# Patient Record
Sex: Female | Born: 1955 | Race: White | Hispanic: No | Marital: Married | Smoking: Never smoker
Health system: Southern US, Community
[De-identification: ages and names within clinical notes are randomized; demographics above are authoritative.]

## PROBLEM LIST (undated history)

## (undated) DIAGNOSIS — C50919 Malignant neoplasm of unspecified site of unspecified female breast: Secondary | ICD-10-CM

## (undated) DIAGNOSIS — Z923 Personal history of irradiation: Secondary | ICD-10-CM

## (undated) DIAGNOSIS — M1990 Unspecified osteoarthritis, unspecified site: Secondary | ICD-10-CM

## (undated) HISTORY — PX: ABDOMINAL HYSTERECTOMY: SHX81

## (undated) HISTORY — PX: OOPHORECTOMY: SHX86

---

## 2004-07-17 ENCOUNTER — Ambulatory Visit: Payer: Self-pay | Admitting: Internal Medicine

## 2005-02-18 ENCOUNTER — Ambulatory Visit: Payer: Self-pay | Admitting: Orthopedic Surgery

## 2005-10-14 ENCOUNTER — Ambulatory Visit: Payer: Self-pay | Admitting: Internal Medicine

## 2006-09-08 ENCOUNTER — Ambulatory Visit: Payer: Self-pay | Admitting: Internal Medicine

## 2006-12-06 ENCOUNTER — Ambulatory Visit: Payer: Self-pay | Admitting: Internal Medicine

## 2007-12-25 ENCOUNTER — Ambulatory Visit: Payer: Self-pay | Admitting: Unknown Physician Specialty

## 2007-12-28 ENCOUNTER — Ambulatory Visit: Payer: Self-pay | Admitting: Internal Medicine

## 2008-05-03 DIAGNOSIS — C50919 Malignant neoplasm of unspecified site of unspecified female breast: Secondary | ICD-10-CM

## 2008-05-03 HISTORY — PX: BREAST EXCISIONAL BIOPSY: SUR124

## 2008-05-03 HISTORY — PX: BREAST LUMPECTOMY: SHX2

## 2008-05-03 HISTORY — DX: Malignant neoplasm of unspecified site of unspecified female breast: C50.919

## 2009-01-30 ENCOUNTER — Ambulatory Visit: Payer: Self-pay | Admitting: Internal Medicine

## 2009-02-06 ENCOUNTER — Ambulatory Visit: Payer: Self-pay | Admitting: Internal Medicine

## 2009-03-03 ENCOUNTER — Ambulatory Visit: Payer: Self-pay | Admitting: Internal Medicine

## 2009-03-13 ENCOUNTER — Ambulatory Visit: Payer: Self-pay | Admitting: Surgery

## 2009-03-25 ENCOUNTER — Ambulatory Visit: Payer: Self-pay | Admitting: Internal Medicine

## 2009-04-02 ENCOUNTER — Ambulatory Visit: Payer: Self-pay | Admitting: Radiation Oncology

## 2009-04-02 ENCOUNTER — Ambulatory Visit: Payer: Self-pay | Admitting: Internal Medicine

## 2009-05-03 ENCOUNTER — Ambulatory Visit: Payer: Self-pay | Admitting: Internal Medicine

## 2009-05-03 ENCOUNTER — Ambulatory Visit: Payer: Self-pay | Admitting: Radiation Oncology

## 2009-06-03 ENCOUNTER — Ambulatory Visit: Payer: Self-pay | Admitting: Radiation Oncology

## 2009-06-03 ENCOUNTER — Ambulatory Visit: Payer: Self-pay | Admitting: Internal Medicine

## 2009-07-01 ENCOUNTER — Ambulatory Visit: Payer: Self-pay | Admitting: Radiation Oncology

## 2009-07-01 ENCOUNTER — Ambulatory Visit: Payer: Self-pay | Admitting: Internal Medicine

## 2009-08-01 ENCOUNTER — Ambulatory Visit: Payer: Self-pay | Admitting: Internal Medicine

## 2009-08-31 ENCOUNTER — Ambulatory Visit: Payer: Self-pay | Admitting: Internal Medicine

## 2009-09-10 ENCOUNTER — Ambulatory Visit: Payer: Self-pay | Admitting: Internal Medicine

## 2009-10-01 ENCOUNTER — Ambulatory Visit: Payer: Self-pay | Admitting: Internal Medicine

## 2010-01-01 ENCOUNTER — Ambulatory Visit: Payer: Self-pay | Admitting: Internal Medicine

## 2010-01-09 ENCOUNTER — Ambulatory Visit: Payer: Self-pay | Admitting: Internal Medicine

## 2010-01-31 ENCOUNTER — Ambulatory Visit: Payer: Self-pay | Admitting: Internal Medicine

## 2010-02-10 ENCOUNTER — Ambulatory Visit: Payer: Self-pay | Admitting: Internal Medicine

## 2010-07-08 ENCOUNTER — Ambulatory Visit: Payer: Self-pay | Admitting: Internal Medicine

## 2010-07-25 IMAGING — CT CT GUIDANCE PLACEMENT RAD THERAPY FIELDS
1 series · 16 of 32 positions shown, 20 images · non-contrast
Comparison: none

[Series 2: tx planning · axial · 0.98mm/px · z∈[-170,+155]mm · 16 of 138 slices shown, 20 images]
[im 9/138  soft-tissue]
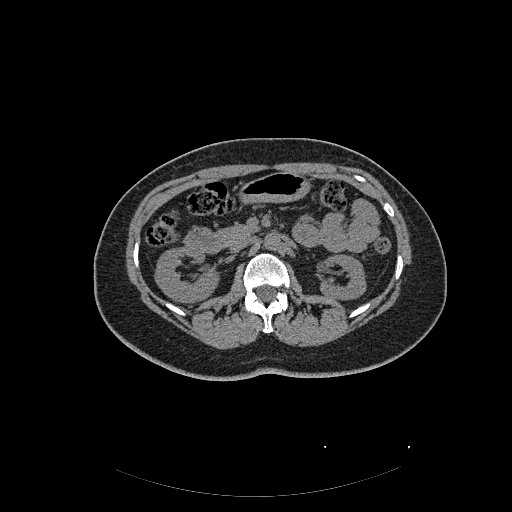
[im 9/138  bone]
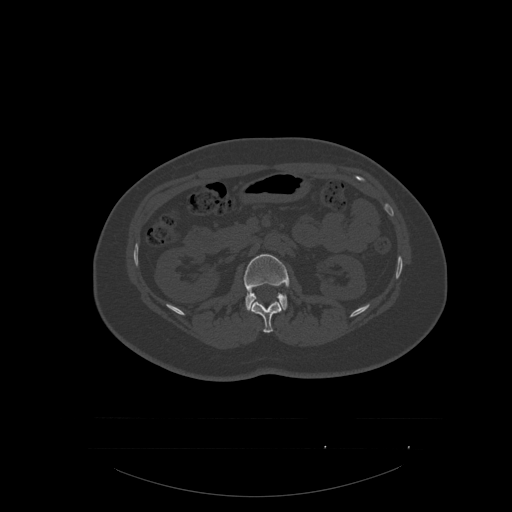
[im 18/138  soft-tissue]
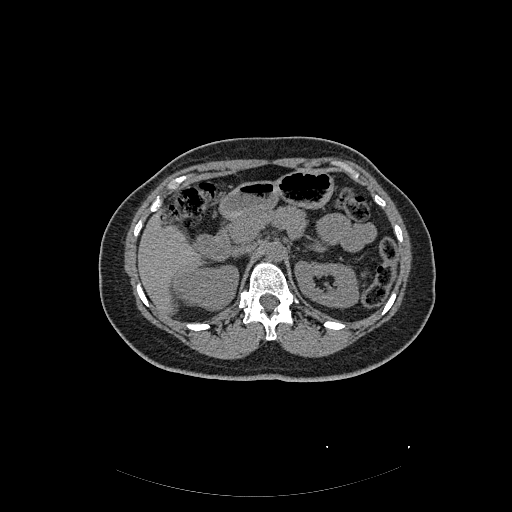
[im 27/138  soft-tissue]
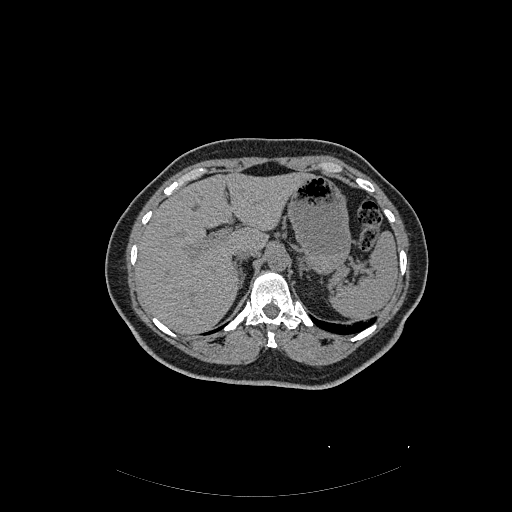
[im 36/138  soft-tissue]
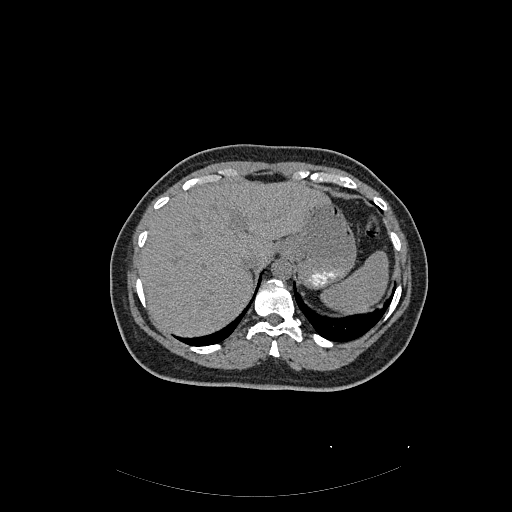
[im 45/138  soft-tissue]
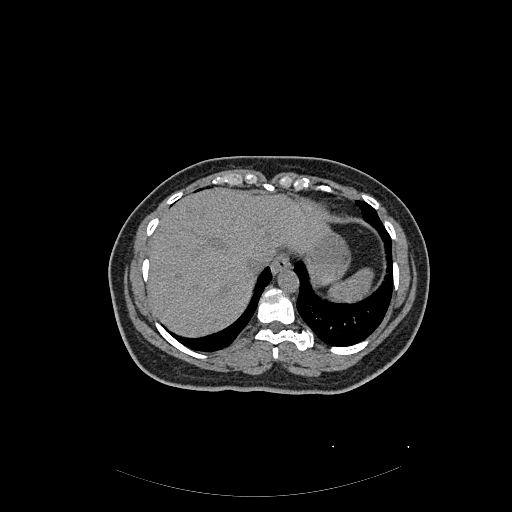
[im 54/138  soft-tissue]
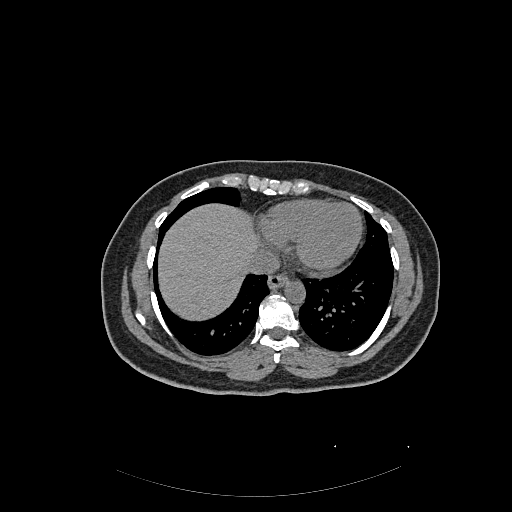
[im 62/138  soft-tissue]
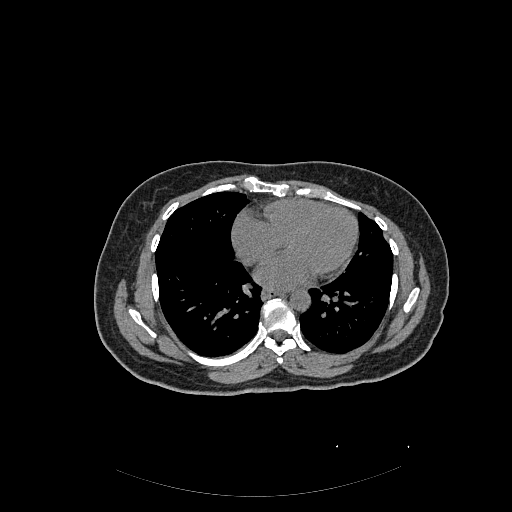
[im 76/138  soft-tissue]
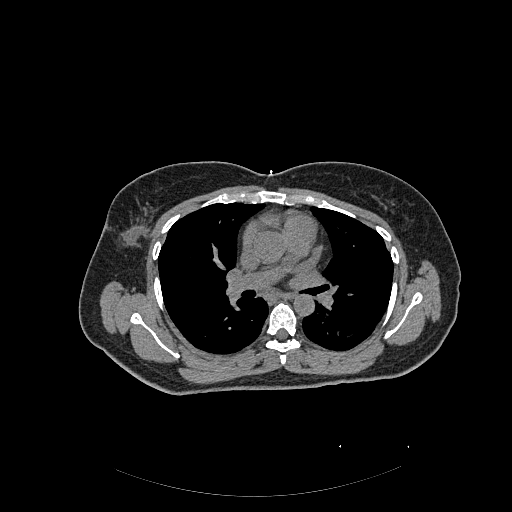
[im 84/138  soft-tissue]
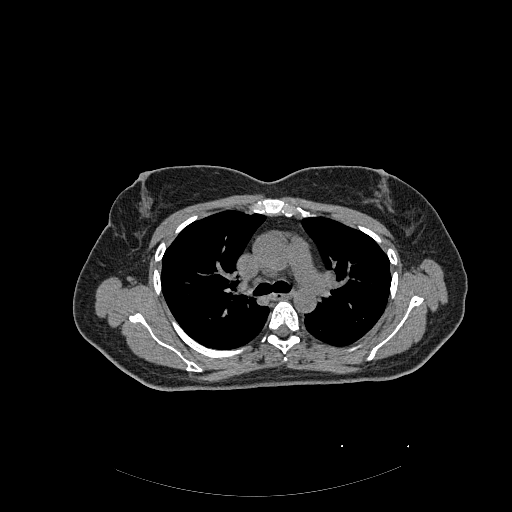
[im 84/138  bone]
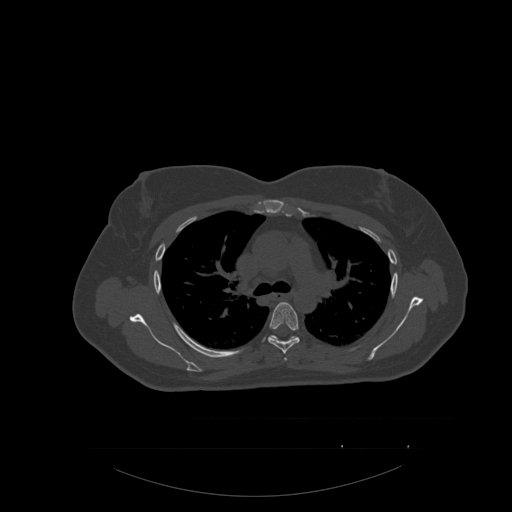
[im 93/138  soft-tissue]
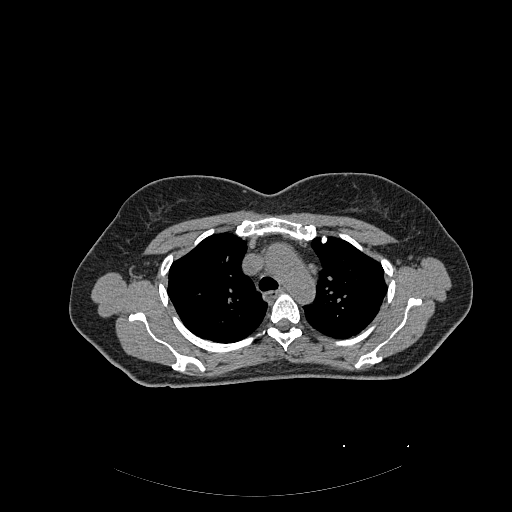
[im 102/138  soft-tissue]
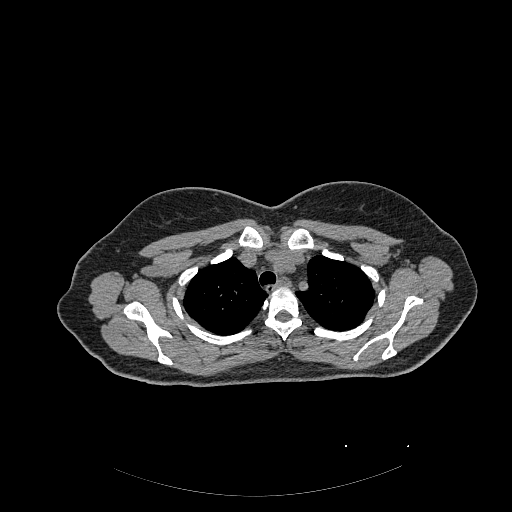
[im 111/138  soft-tissue]
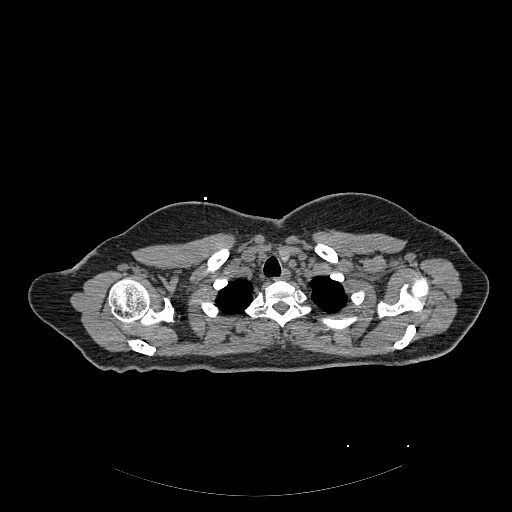
[im 120/138  soft-tissue]
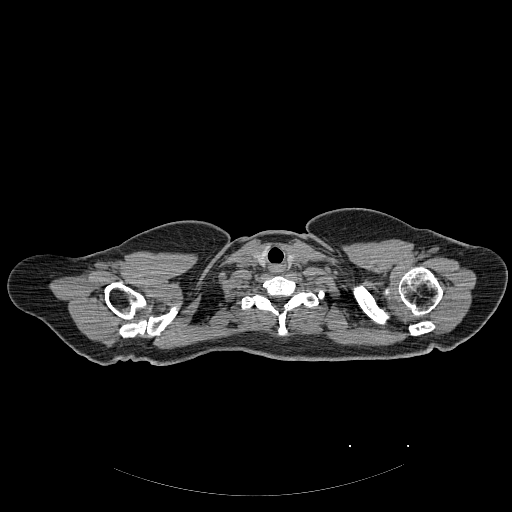
[im 120/138  lung]
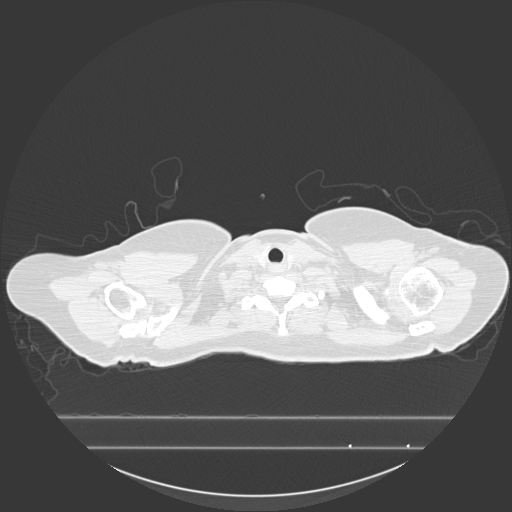
[im 124/138  lung]
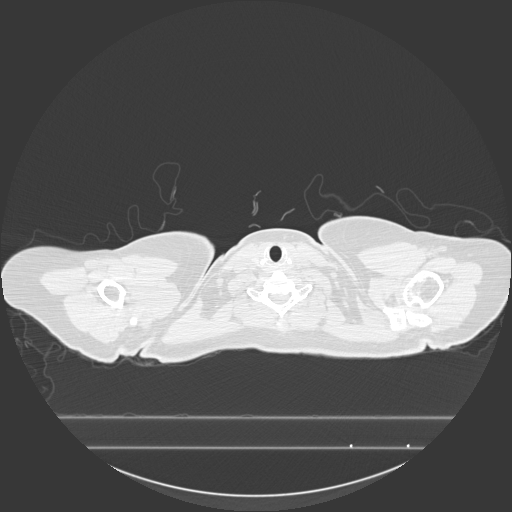
[im 129/138  soft-tissue]
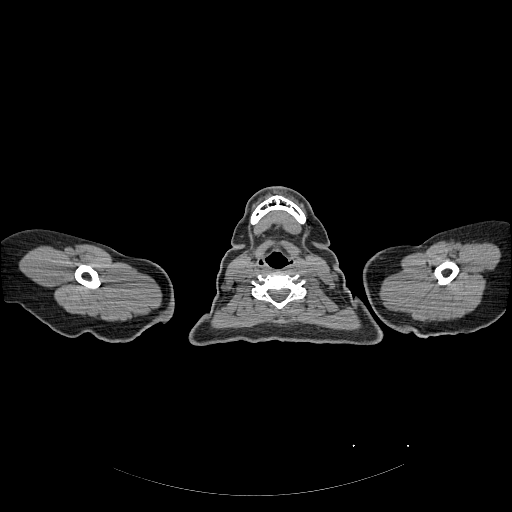
[im 129/138  lung]
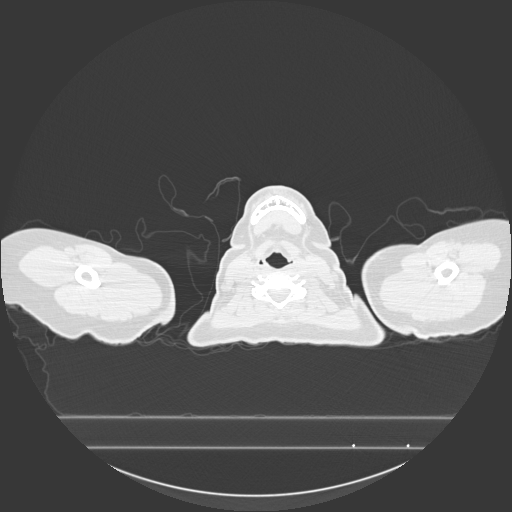
[im 133/138  lung]
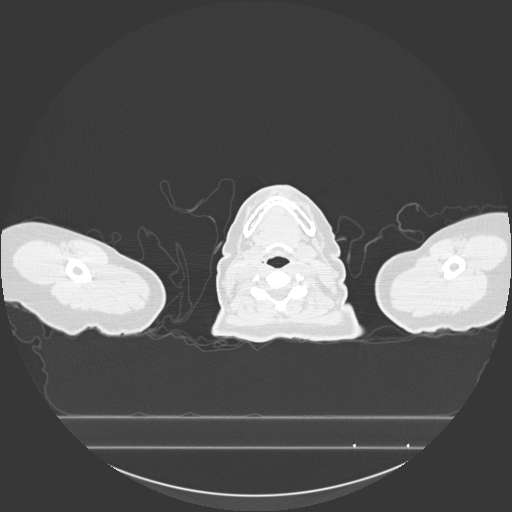

[16 of 32 positions shown; findings below may reference images not displayed]

IMAGES IMPORTED FROM THE [employer] WORKFLOW SYSTEM
NO DICTATION FOR STUDY

## 2010-08-02 ENCOUNTER — Ambulatory Visit: Payer: Self-pay | Admitting: Internal Medicine

## 2010-08-17 ENCOUNTER — Ambulatory Visit: Payer: Self-pay | Admitting: Internal Medicine

## 2011-01-15 ENCOUNTER — Ambulatory Visit: Payer: Self-pay | Admitting: Internal Medicine

## 2011-02-01 ENCOUNTER — Ambulatory Visit: Payer: Self-pay | Admitting: Internal Medicine

## 2011-02-15 ENCOUNTER — Ambulatory Visit: Payer: Self-pay | Admitting: Internal Medicine

## 2011-07-07 ENCOUNTER — Ambulatory Visit: Payer: Self-pay | Admitting: Internal Medicine

## 2011-07-16 LAB — CBC CANCER CENTER
Basophil #: 0 x10 3/mm (ref 0.0–0.1)
Basophil %: 0.3 %
Eosinophil #: 0.3 x10 3/mm (ref 0.0–0.7)
Eosinophil %: 4.4 %
HGB: 12.1 g/dL (ref 12.0–16.0)
Lymphocyte %: 30.7 %
MCHC: 34.5 g/dL (ref 32.0–36.0)
Monocyte %: 12.4 %
Neutrophil #: 3.3 x10 3/mm (ref 1.4–6.5)
Neutrophil %: 52.2 %
RDW: 12.8 % (ref 11.5–14.5)
WBC: 6.4 x10 3/mm (ref 3.6–11.0)

## 2011-07-16 LAB — CREATININE, SERUM
Creatinine: 0.85 mg/dL (ref 0.60–1.30)
EGFR (African American): >60
EGFR (Non-African Amer.): >60

## 2011-07-16 LAB — HEPATIC FUNCTION PANEL A (ARMC)
Alkaline Phosphatase: 72 U/L (ref 50–136)
Bilirubin, Direct: 0.1 mg/dL (ref 0.00–0.20)
Bilirubin,Total: 0.2 mg/dL (ref 0.2–1.0)
SGOT(AST): 22 U/L (ref 15–37)
SGPT (ALT): 25 U/L

## 2011-08-02 ENCOUNTER — Ambulatory Visit: Payer: Self-pay | Admitting: Internal Medicine

## 2012-01-31 ENCOUNTER — Ambulatory Visit: Payer: Self-pay | Admitting: Internal Medicine

## 2012-01-31 LAB — CBC CANCER CENTER
Basophil #: 0 x10 3/mm (ref 0.0–0.1)
Eosinophil %: 1.8 %
HCT: 36.1 % (ref 35.0–47.0)
HGB: 11.9 g/dL — ABNORMAL LOW (ref 12.0–16.0)
MCH: 32 pg (ref 26.0–34.0)
MCHC: 33 g/dL (ref 32.0–36.0)
MCV: 97 fL (ref 80–100)
Monocyte #: 0.6 x10 3/mm (ref 0.2–0.9)
Monocyte %: 10.3 %
Neutrophil %: 57.6 %
Platelet: 168 x10 3/mm (ref 150–440)
RBC: 3.72 10*6/uL — ABNORMAL LOW (ref 3.80–5.20)

## 2012-01-31 LAB — HEPATIC FUNCTION PANEL A (ARMC)
Alkaline Phosphatase: 72 U/L (ref 50–136)
Bilirubin, Direct: 0.1 mg/dL (ref 0.00–0.20)
Bilirubin,Total: 0.2 mg/dL (ref 0.2–1.0)
SGPT (ALT): 19 U/L (ref 12–78)
Total Protein: 7.2 g/dL (ref 6.4–8.2)

## 2012-01-31 LAB — CREATININE, SERUM: EGFR (Non-African Amer.): >60

## 2012-02-01 ENCOUNTER — Ambulatory Visit: Payer: Self-pay | Admitting: Internal Medicine

## 2012-02-23 ENCOUNTER — Ambulatory Visit: Payer: Self-pay | Admitting: Internal Medicine

## 2012-07-04 ENCOUNTER — Ambulatory Visit: Payer: Self-pay | Admitting: Internal Medicine

## 2012-08-01 ENCOUNTER — Ambulatory Visit: Payer: Self-pay | Admitting: Internal Medicine

## 2012-08-24 ENCOUNTER — Ambulatory Visit: Payer: Self-pay | Admitting: Podiatry

## 2013-02-26 ENCOUNTER — Ambulatory Visit: Payer: Self-pay | Admitting: Family Medicine

## 2013-09-06 ENCOUNTER — Ambulatory Visit: Payer: Self-pay | Admitting: Internal Medicine

## 2013-09-06 LAB — CBC CANCER CENTER
BASOS PCT: 0.2 %
Basophil #: 0 x10 3/mm (ref 0.0–0.1)
Eosinophil #: 0.3 x10 3/mm (ref 0.0–0.7)
Eosinophil %: 4.3 %
HCT: 37.4 % (ref 35.0–47.0)
HGB: 12.5 g/dL (ref 12.0–16.0)
LYMPHS ABS: 1.8 x10 3/mm (ref 1.0–3.6)
Lymphocyte %: 29.4 %
MCH: 32.2 pg (ref 26.0–34.0)
MCHC: 33.3 g/dL (ref 32.0–36.0)
MCV: 97 fL (ref 80–100)
Monocyte #: 0.8 x10 3/mm (ref 0.2–0.9)
Monocyte %: 13.3 %
Neutrophil #: 3.1 x10 3/mm (ref 1.4–6.5)
Neutrophil %: 52.8 %
PLATELETS: 192 x10 3/mm (ref 150–440)
RBC: 3.87 10*6/uL (ref 3.80–5.20)
RDW: 13.6 % (ref 11.5–14.5)
WBC: 6 x10 3/mm (ref 3.6–11.0)

## 2013-09-06 LAB — HEPATIC FUNCTION PANEL A (ARMC)
ALT: 24 U/L (ref 12–78)
Albumin: 3.4 g/dL (ref 3.4–5.0)
Alkaline Phosphatase: 57 U/L
Bilirubin, Direct: 0.1 mg/dL (ref 0.00–0.20)
Bilirubin,Total: 0.5 mg/dL (ref 0.2–1.0)
SGOT(AST): 20 U/L (ref 15–37)
TOTAL PROTEIN: 7 g/dL (ref 6.4–8.2)

## 2013-09-06 LAB — CREATININE, SERUM
Creatinine: 0.9 mg/dL (ref 0.60–1.30)
EGFR (Non-African Amer.): >60

## 2013-10-01 ENCOUNTER — Ambulatory Visit: Payer: Self-pay | Admitting: Internal Medicine

## 2014-03-21 ENCOUNTER — Ambulatory Visit: Payer: Self-pay | Admitting: Family Medicine

## 2015-02-12 ENCOUNTER — Other Ambulatory Visit: Payer: Self-pay | Admitting: Internal Medicine

## 2015-04-02 ENCOUNTER — Other Ambulatory Visit: Payer: Self-pay | Admitting: Family Medicine

## 2015-04-02 DIAGNOSIS — Z853 Personal history of malignant neoplasm of breast: Secondary | ICD-10-CM

## 2015-04-18 ENCOUNTER — Other Ambulatory Visit: Payer: Self-pay

## 2015-04-18 ENCOUNTER — Ambulatory Visit: Payer: Self-pay

## 2015-04-30 ENCOUNTER — Ambulatory Visit
Admission: RE | Admit: 2015-04-30 | Discharge: 2015-04-30 | Disposition: A | Discharge status: Home / Self Care | Payer: BC Managed Care – PPO | Source: Ambulatory Visit | Attending: Family Medicine | Admitting: Family Medicine

## 2015-04-30 ENCOUNTER — Ambulatory Visit: Payer: Self-pay

## 2015-04-30 DIAGNOSIS — Z9889 Other specified postprocedural states: Secondary | ICD-10-CM | POA: Diagnosis not present

## 2015-04-30 DIAGNOSIS — Z853 Personal history of malignant neoplasm of breast: Secondary | ICD-10-CM | POA: Diagnosis present

## 2015-04-30 HISTORY — DX: Malignant neoplasm of unspecified site of unspecified female breast: C50.919

## 2016-04-22 ENCOUNTER — Other Ambulatory Visit: Payer: Self-pay | Admitting: Family Medicine

## 2016-04-22 DIAGNOSIS — R1312 Dysphagia, oropharyngeal phase: Secondary | ICD-10-CM

## 2016-04-29 ENCOUNTER — Other Ambulatory Visit: Payer: Self-pay | Admitting: Family Medicine

## 2016-04-29 DIAGNOSIS — Z1231 Encounter for screening mammogram for malignant neoplasm of breast: Secondary | ICD-10-CM

## 2016-05-12 ENCOUNTER — Ambulatory Visit
Admission: RE | Admit: 2016-05-12 | Discharge: 2016-05-12 | Disposition: A | Discharge status: Home / Self Care | Payer: BC Managed Care – PPO | Source: Ambulatory Visit | Attending: Family Medicine | Admitting: Family Medicine

## 2016-05-12 ENCOUNTER — Encounter: Payer: Self-pay | Admitting: Radiology

## 2016-05-12 DIAGNOSIS — Z1231 Encounter for screening mammogram for malignant neoplasm of breast: Secondary | ICD-10-CM | POA: Insufficient documentation

## 2016-05-12 HISTORY — DX: Personal history of irradiation: Z92.3

## 2016-05-17 ENCOUNTER — Ambulatory Visit
Admission: RE | Admit: 2016-05-17 | Discharge: 2016-05-17 | Disposition: A | Discharge status: Home / Self Care | Payer: BC Managed Care – PPO | Source: Ambulatory Visit | Attending: Family Medicine | Admitting: Family Medicine

## 2016-05-17 DIAGNOSIS — R1310 Dysphagia, unspecified: Secondary | ICD-10-CM | POA: Insufficient documentation

## 2016-05-17 DIAGNOSIS — R1312 Dysphagia, oropharyngeal phase: Secondary | ICD-10-CM

## 2016-05-17 NOTE — Therapy (Signed)
[place], [place], [address] Phone: [phone]   Fax:     Modified Barium Swallow  Patient Details  Name: Sabrina Turner MRN: [id number] Date of Birth: 1956-03-22 No Data Recorded  Encounter Date: 05/17/2016      End of Session - 05/17/16 1340    Visit Number 1   Number of Visits 1   Date for SLP Re-Evaluation 05/17/16      Past Medical History:  Diagnosis Date  . Breast cancer ([place]) 2010   right breast lumpectomy with rad tx  . Personal history of radiation therapy     Past Surgical History:  Procedure Laterality Date  . ABDOMINAL HYSTERECTOMY    . OOPHORECTOMY      There were no vitals filed for this visit.   Subjective: Patient behavior: (alertness, ability to follow instructions, etc.): Patient is able to relay her swallowing history and follow directions  Chief complaint: "difficulty and painful swallowing especially when she eats big bites and eats fast"   Objective:  Radiological Procedure: A videoflouroscopic evaluation of oral-preparatory, reflex initiation, and pharyngeal phases of the swallow was performed; as well as a screening of the upper esophageal phase.  I. POSTURE: Upright in MBS chair  II. VIEW: Lateral, A-P  III. COMPENSATORY STRATEGIES: N/A  IV. BOLUSES ADMINISTERED:   Thin Liquid: 2 teaspoon presentations, 3 rapid consecutive sips   Nectar-thick Liquid: 2 moderate size sips    Puree: 2 teaspoon presentations   Mechanical Soft: 1/4 graham cracker in applesauce  V. RESULTS OF EVALUATION: A. ORAL PREPARATORY PHASE: (The lips, tongue, and velum are observed for strength and coordination)       **Overall Severity Rating: Within normal limits  B. SWALLOW INITIATION/REFLEX: (The reflex is normal if "triggered" by the time the bolus reached the base of the tongue)  **Overall Severity Rating: Within normal limits  C. PHARYNGEAL PHASE: (Pharyngeal  function is normal if the bolus shows rapid, smooth, and continuous transit through the pharynx and there is no pharyngeal residue after the swallow)  **Overall Severity Rating:Minimal; decreased tongue base retraction, decreased hyolaryngeal movement, trace to mild pharyngeal residue  D. LARYNGEAL PENETRATION: (Material entering into the laryngeal inlet/vestibule but not aspirated) None  E. ASPIRATION: None  F. ESOPHAGEAL PHASE: (Screening of the upper esophagus) In the cervical esophagus there is a finger-like protrusion along the posterior wall during swallow (does not impede flow of boluses) consistent with prominent cricopharyngeus.  An esophageal sweep in the upright position with liquid and solid consistencies showed mid- and distal-esophageal retention with retrograde flow below the level of the pharyngoesophageal segment.     ASSESSMENT: 61 year old woman with report of dysphagia ("difficulty and painful swallowing especially when she eats big bites and eats fast") is presenting with minimal oropharyngeal dysphagia characterized by minimally reduced pharyngeal pressure generation due to minimally reduced hyolaryngeal movement and minimally reduced tongue base retraction with trace-to-mild pharyngeal residue post swallow.  Oral control of the bolus including oral hold, rotary mastication, and anterior to posterior transfer are within normal limits. Timing of the pharyngeal swallow is within normal limits.  There was no observed laryngeal penetration or aspiration during this study.  In the cervical esophagus there is a finger-like protrusion along the posterior wall during swallow (does not impede flow of boluses) consistent with prominent cricopharyngeus.  An esophageal sweep in the upright position with liquid and solid consistencies showed mid- and distal-esophageal retention with retrograde flow below  the level of the pharyngoesophageal segment.   The patient's pain may be due to effortfull  swallowing to move food along, with muscle fatigue causing pain.  She was advised that her swallow is safe and the symptoms are uncomfortable rather than indicative of a serious problem.  The pharyngeal findings are consistent with effects of laryngopharyngeal reflux (inflammation, edema, and resultant decreased sensation of the larynx and pharynx).  The patient may benefit from referral to GI for esophageal assessment and ENT to assess for silent reflux.  PLAN/RECOMMENDATIONS:   A. Diet: Regular; may want to soften or moisten for comfort   B. Swallowing Precautions: When discomfort occurs, stop eating, slowly sip liquid to encourage peristalic waves, and swallow gently   C. Recommended consultation to: GI and ENT   D. Therapy recommendations N/A   E. Results and recommendations were discussed with the patient immediately following the study and the final report routed to the referring MD.    Dysphagia, oropharyngeal - Plan: DG SWALLOWING FUNC-SPEECH PATHOLOGY, DG SWALLOWING FUNC-SPEECH PATHOLOGY        Problem List There are no active problems to display for this patient.  Leroy Sea, MS/CCC- SLP  Lou Miner 05/17/2016, 1:40 PM  [facility] [place], [place], [address] Phone: [phone]   Fax:     Name: Sabrina Turner MRN: [id number] Date of Birth: December 14, 1955

## 2016-10-04 ENCOUNTER — Ambulatory Visit: Admit: 2016-10-04 | Payer: BC Managed Care – PPO | Admitting: Unknown Physician Specialty

## 2016-10-04 SURGERY — ESOPHAGOGASTRODUODENOSCOPY (EGD) WITH PROPOFOL
Anesthesia: General

## 2017-09-13 ENCOUNTER — Other Ambulatory Visit: Payer: Self-pay | Admitting: Family Medicine

## 2017-09-13 DIAGNOSIS — Z1231 Encounter for screening mammogram for malignant neoplasm of breast: Secondary | ICD-10-CM

## 2017-09-20 ENCOUNTER — Ambulatory Visit: Payer: BC Managed Care – PPO

## 2017-10-03 ENCOUNTER — Inpatient Hospital Stay: Admission: RE | Admit: 2017-10-03 | Payer: BC Managed Care – PPO | Source: Ambulatory Visit

## 2017-11-23 ENCOUNTER — Ambulatory Visit
Admission: RE | Admit: 2017-11-23 | Discharge: 2017-11-23 | Disposition: A | Discharge status: Home / Self Care | Payer: BC Managed Care – PPO | Source: Ambulatory Visit | Attending: Family Medicine | Admitting: Family Medicine

## 2017-11-23 DIAGNOSIS — Z1231 Encounter for screening mammogram for malignant neoplasm of breast: Secondary | ICD-10-CM | POA: Insufficient documentation

## 2018-04-18 ENCOUNTER — Other Ambulatory Visit: Payer: Self-pay

## 2018-04-18 ENCOUNTER — Encounter: Payer: Self-pay | Admitting: *Deleted

## 2018-04-24 NOTE — Discharge Instructions (Signed)
[facility] °[facility] °ENDOSCOPIC SINUS SURGERY °[facility] ° °What is Functional Endoscopic Sinus Surgery? ° The Surgery involves making the natural openings of the sinuses larger by removing the bony partitions that separate the sinuses from the nasal cavity.  The natural sinus lining is preserved as much as possible to allow the sinuses to resume normal function after the surgery.  In some patients nasal polyps (excessively swollen lining of the sinuses) may be removed to relieve obstruction of the sinus openings.  The surgery is performed through the nose using lighted scopes, which eliminates the need for incisions on the face.  A septoplasty is a different procedure which is sometimes performed with sinus surgery.  It involves straightening the boy partition that separates the two sides of your nose.  A crooked or deviated septum may need repair if is obstructing the sinuses or nasal airflow.  Turbinate reduction is also often performed during sinus surgery.  The turbinates are bony proturberances from the side walls of the nose which swell and can obstruct the nose in patients with sinus and allergy problems.  Their size can be surgically reduced to help relieve nasal obstruction. ° °What Can Sinus Surgery Do For Me? ° Sinus surgery can reduce the frequency of sinus infections requiring antibiotic treatment.  This can provide improvement in nasal congestion, post-nasal drainage, facial pressure and nasal obstruction.  Surgery will NOT prevent you from ever having an infection again, so it usually only for patients who get infections 4 or more times yearly requiring antibiotics, or for infections that do not clear with antibiotics.  It will not cure nasal allergies, so patients with allergies may still require medication to treat their allergies after surgery. Surgery may improve headaches related to sinusitis, however, some people will continue to  require medication to control sinus headaches related to allergies.  Surgery will do nothing for other forms of headache (migraine, tension or cluster). ° °What Are the Risks of Endoscopic Sinus Surgery? ° Current techniques allow surgery to be performed safely with little risk, however, there are rare complications that patients should be aware of.  Because the sinuses are located around the eyes, there is risk of eye injury, including blindness, though again, this would be quite rare. This is usually a result of bleeding behind the eye during surgery, which puts the vision oat risk, though there are treatments to protect the vision and prevent permanent disrupted by surgery causing a leak of the spinal fluid that surrounds the brain.  More serious complications would include bleeding inside the brain cavity or damage to the brain.  Again, all of these complications are uncommon, and spinal fluid leaks can be safely managed surgically if they occur.  The most common complication of sinus surgery is bleeding from the nose, which may require packing or cauterization of the nose.  Continued sinus have polyps may experience recurrence of the polyps requiring revision surgery.  Alterations of sense of smell or injury to the tear ducts are also rare complications.  ° °What is the Surgery Like, and what is the Recovery? ° The Surgery usually takes a couple of hours to perform, and is usually performed under a general anesthetic (completely asleep).  Patients are usually discharged home after a couple of hours.  Sometimes during surgery it is necessary to pack the nose to control bleeding, and the packing is left in place for 24 - 48 hours, and removed by your surgeon.    If a septoplasty was performed during the procedure, there is often a splint placed which must be removed after 5-7 days.   °Discomfort: Pain is usually mild to moderate, and can be controlled by prescription pain medication or acetaminophen (Tylenol).   Aspirin, Ibuprofen (Advil, Motrin), or Naprosyn (Aleve) should be avoided, as they can cause increased bleeding.  Most patients feel sinus pressure like they have a bad head cold for several days.  Sleeping with your head elevated can help reduce swelling and facial pressure, as can ice packs over the face.  A humidifier may be helpful to keep the mucous and blood from drying in the nose.  ° °Diet: There are no specific diet restrictions, however, you should generally start with clear liquids and a light diet of bland foods because the anesthetic can cause some nausea.  Advance your diet depending on how your stomach feels.  Taking your pain medication with food will often help reduce stomach upset which pain medications can cause. ° °Nasal Saline Irrigation: It is important to remove blood clots and dried mucous from the nose as it is healing.  This is done by having you irrigate the nose at least 3 - 4 times daily with a salt water solution.  We recommend using NeilMed Sinus Rinse (available at the drug store).  Fill the squeeze bottle with the solution, bend over a sink, and insert the tip of the squeeze bottle into the nose ½ of an inch.  Point the tip of the squeeze bottle towards the inside corner of the eye on the same side your irrigating.  Squeeze the bottle and gently irrigate the nose.  If you bend forward as you do this, most of the fluid will flow back out of the nose, instead of down your throat.   The solution should be warm, near body temperature, when you irrigate.   Each time you irrigate, you should use a full squeeze bottle.  ° °Note that if you are instructed to use Nasal Steroid Sprays at any time after your surgery, irrigate with saline BEFORE using the steroid spray, so you do not wash it all out of the nose. °Another product, Nasal Saline Gel (such as AYR Nasal Saline Gel) can be applied in each nostril 3 - 4 times daily to moisture the nose and reduce scabbing or crusting. ° °Bleeding:   Bloody drainage from the nose can be expected for several days, and patients are instructed to irrigate their nose frequently with salt water to help remove mucous and blood clots.  The drainage may be dark red or brown, though some fresh blood may be seen intermittently, especially after irrigation.  Do not blow you nose, as bleeding may occur. If you must sneeze, keep your mouth open to allow air to escape through your mouth. ° °If heavy bleeding occurs: Irrigate the nose with saline to rinse out clots, then spray the nose 3 - 4 times with Afrin Nasal Decongestant Spray.  The spray will constrict the blood vessels to slow bleeding.  Pinch the lower half of your nose shut to apply pressure, and lay down with your head elevated.  Ice packs over the nose may help as well. If bleeding persists despite these measures, you should notify your doctor.  Do not use the Afrin routinely to control nasal congestion after surgery, as it can result in worsening congestion and may affect healing.  ° ° ° °Activity: Return to work varies among patients. Most patients will be   out of work at least 5 - 7 days to recover.  Patient may return to work after they are off of narcotic pain medication, and feeling well enough to perform the functions of their job.  Patients must avoid heavy lifting (over 10 pounds) or strenuous physical for 2 weeks after surgery, so your employer may need to assign you to light duty, or keep you out of work longer if light duty is not possible.  NOTE: you should not drive, operate dangerous machinery, do any mentally demanding tasks or make any important legal or financial decisions while on narcotic pain medication and recovering from the general anesthetic.  °  °Call Your Doctor Immediately if You Have Any of the Following: °1. Bleeding that you cannot control with the above measures °2. Loss of vision, double vision, bulging of the eye or black eyes. °3. Fever over 101 degrees °4. Neck stiffness with  severe headache, fever, nausea and change in mental state. °You are always encourage to call anytime with concerns, however, please call with requests for pain medication refills during office hours. ° °Office Endoscopy: During follow-up visits your doctor will remove any packing or splints that may have been placed and evaluate and clean your sinuses endoscopically.  Topical anesthetic will be used to make this as comfortable as possible, though you may want to take your pain medication prior to the visit.  How often this will need to be done varies from patient to patient.  After complete recovery from the surgery, you may need follow-up endoscopy from time to time, particularly if there is concern of recurrent infection or nasal polyps. ° ° °General Anesthesia, Adult, Care After °This sheet gives you information about how to care for yourself after your procedure. Your health care provider may also give you more specific instructions. If you have problems or questions, contact your health care provider. °What can I expect after the procedure? °After the procedure, the following side effects are common: °· Pain or discomfort at the IV site. °· Nausea. °· Vomiting. °· Sore throat. °· Trouble concentrating. °· Feeling cold or chills. °· Weak or tired. °· Sleepiness and fatigue. °· Soreness and body aches. These side effects can affect parts of the body that were not involved in surgery. °Follow these instructions at home: ° °For at least 24 hours after the procedure: °· Have a responsible adult stay with you. It is important to have someone help care for you until you are awake and alert. °· Rest as needed. °· Do not: °? Participate in activities in which you could fall or become injured. °? Drive. °? Use heavy machinery. °? Drink alcohol. °? Take sleeping pills or medicines that cause drowsiness. °? Make important decisions or sign legal documents. °? Take care of children on your own. °Eating and  drinking °· Follow any instructions from your health care provider about eating or drinking restrictions. °· When you feel hungry, start by eating small amounts of foods that are soft and easy to digest (bland), such as toast. Gradually return to your regular diet. °· Drink enough fluid to keep your urine pale yellow. °· If you vomit, rehydrate by drinking water, juice, or clear broth. °General instructions °· If you have sleep apnea, surgery and certain medicines can increase your risk for breathing problems. Follow instructions from your health care provider about wearing your sleep device: °? Anytime you are sleeping, including during daytime naps. °? While taking prescription pain medicines, sleeping medicines, or medicines   you drowsy. °· Return to your normal activities as told by your health care provider. Ask your health care provider what activities are safe for you. °· Take over-the-counter and prescription medicines only as told by your health care provider. °· If you smoke, do not smoke without supervision. °· Keep all follow-up visits as told by your health care provider. This is important. °Contact a health care provider if: °· You have nausea or vomiting that does not get better with medicine. °· You cannot eat or drink without vomiting. °· You have pain that does not get better with medicine. °· You are unable to pass urine. °· You develop a skin rash. °· You have a fever. °· You have redness around your IV site that gets worse. °Get help right away if: °· You have difficulty breathing. °· You have chest pain. °· You have blood in your urine or stool, or you vomit blood. °Summary °· After the procedure, it is common to have a sore throat or nausea. It is also common to feel tired. °· Have a responsible adult stay with you for the first 24 hours after general anesthesia. It is important to have someone help care for you until you are awake and alert. °· When you feel hungry, start by eating small amounts of foods  that are soft and easy to digest (bland), such as toast. Gradually return to your regular diet. °· Drink enough fluid to keep your urine pale yellow. °· Return to your normal activities as told by your health care provider. Ask your health care provider what activities are safe for you. °This information is not intended to replace advice given to you by your health care provider. Make sure you discuss any questions you have with your health care provider. °Document Released: 07/26/2000 Document Revised: 12/03/2016 Document Reviewed: 12/03/2016 °Elsevier Interactive Patient Education © 2019 [employer] ° °

## 2018-04-27 ENCOUNTER — Encounter
Admission: RE | Disposition: A | Discharge status: Home / Self Care | Payer: Self-pay | Source: Home / Self Care | Attending: Otolaryngology

## 2018-04-27 ENCOUNTER — Ambulatory Visit: Payer: BC Managed Care – PPO | Admitting: Anesthesiology

## 2018-04-27 ENCOUNTER — Ambulatory Visit
Admission: RE | Admit: 2018-04-27 | Discharge: 2018-04-27 | Disposition: A | Discharge status: Home / Self Care | Payer: BC Managed Care – PPO | Attending: Otolaryngology | Admitting: Otolaryngology

## 2018-04-27 DIAGNOSIS — J343 Hypertrophy of nasal turbinates: Secondary | ICD-10-CM | POA: Diagnosis not present

## 2018-04-27 DIAGNOSIS — Z79899 Other long term (current) drug therapy: Secondary | ICD-10-CM | POA: Diagnosis not present

## 2018-04-27 DIAGNOSIS — J328 Other chronic sinusitis: Secondary | ICD-10-CM | POA: Insufficient documentation

## 2018-04-27 DIAGNOSIS — J342 Deviated nasal septum: Secondary | ICD-10-CM | POA: Diagnosis not present

## 2018-04-27 DIAGNOSIS — Z853 Personal history of malignant neoplasm of breast: Secondary | ICD-10-CM | POA: Diagnosis not present

## 2018-04-27 HISTORY — PX: NASAL SEPTOPLASTY W/ TURBINOPLASTY: SHX2070

## 2018-04-27 HISTORY — DX: Unspecified osteoarthritis, unspecified site: M19.90

## 2018-04-27 SURGERY — SEPTOPLASTY, NOSE, WITH NASAL TURBINATE REDUCTION
Anesthesia: General | Site: Nose | Laterality: Bilateral

## 2018-04-27 MED ORDER — SUCCINYLCHOLINE CHLORIDE 20 MG/ML IJ SOLN
INTRAMUSCULAR | Status: DC | PRN
  Administered 2018-04-27: 100 mg via INTRAVENOUS

## 2018-04-27 MED ORDER — HYDROCODONE-ACETAMINOPHEN 5-325 MG PO TABS: 1.0000 | ORAL_TABLET | Freq: Four times a day (QID) | ORAL | 0 refills | Status: AC | PRN

## 2018-04-27 MED ORDER — PHENYLEPHRINE HCL 0.5 % NA SOLN
NASAL | Status: DC | PRN
  Administered 2018-04-27: 13:00:00 via TOPICAL

## 2018-04-27 MED ORDER — GLYCOPYRROLATE 0.2 MG/ML IJ SOLN
INTRAMUSCULAR | Status: DC | PRN
  Administered 2018-04-27: 0.1 mg via INTRAVENOUS

## 2018-04-27 MED ORDER — LACTATED RINGERS IV SOLN
INTRAVENOUS | Status: DC
  Administered 2018-04-27: 12:00:00 via INTRAVENOUS

## 2018-04-27 MED ORDER — MIDAZOLAM HCL 5 MG/5ML IJ SOLN
INTRAMUSCULAR | Status: DC | PRN
  Administered 2018-04-27: 2 mg via INTRAVENOUS

## 2018-04-27 MED ORDER — FENTANYL CITRATE (PF) 100 MCG/2ML IJ SOLN
INTRAMUSCULAR | Status: DC | PRN
  Administered 2018-04-27: 100 ug via INTRAVENOUS

## 2018-04-27 MED ORDER — OXYCODONE HCL 5 MG PO TABS
5.0000 mg | ORAL_TABLET | Freq: Once | ORAL | Status: AC
  Administered 2018-04-27: 5 mg via ORAL

## 2018-04-27 MED ORDER — ACETAMINOPHEN 160 MG/5ML PO SOLN: 325.0000 mg | ORAL | Status: DC | PRN

## 2018-04-27 MED ORDER — DEXAMETHASONE SODIUM PHOSPHATE 4 MG/ML IJ SOLN
INTRAMUSCULAR | Status: DC | PRN
  Administered 2018-04-27: 10 mg via INTRAVENOUS

## 2018-04-27 MED ORDER — ONDANSETRON HCL 4 MG/2ML IJ SOLN
INTRAMUSCULAR | Status: DC | PRN
  Administered 2018-04-27: 4 mg via INTRAVENOUS

## 2018-04-27 MED ORDER — ONDANSETRON HCL 4 MG/2ML IJ SOLN
4.0000 mg | Freq: Once | INTRAMUSCULAR | Status: AC
  Administered 2018-04-27: 4 mg via INTRAVENOUS

## 2018-04-27 MED ORDER — LIDOCAINE HCL (CARDIAC) PF 100 MG/5ML IV SOSY
PREFILLED_SYRINGE | INTRAVENOUS | Status: DC | PRN
  Administered 2018-04-27: 40 mg via INTRAVENOUS

## 2018-04-27 MED ORDER — LIDOCAINE-EPINEPHRINE 1 %-1:100000 IJ SOLN
INTRAMUSCULAR | Status: DC | PRN
  Administered 2018-04-27: 6.5 mL

## 2018-04-27 MED ORDER — ACETAMINOPHEN 325 MG PO TABS: 325.0000 mg | ORAL_TABLET | ORAL | Status: DC | PRN

## 2018-04-27 MED ORDER — PROPOFOL 10 MG/ML IV BOLUS
INTRAVENOUS | Status: DC | PRN
  Administered 2018-04-27: 150 mg via INTRAVENOUS

## 2018-04-27 MED ORDER — CEPHALEXIN 500 MG PO CAPS: 500.0000 mg | ORAL_CAPSULE | Freq: Two times a day (BID) | ORAL | 0 refills | Status: DC

## 2018-04-27 MED ORDER — OXYMETAZOLINE HCL 0.05 % NA SOLN
1.0000 | Freq: Two times a day (BID) | NASAL | Status: DC
  Administered 2018-04-27: 1 via NASAL

## 2018-04-27 MED ORDER — CEFAZOLIN SODIUM-DEXTROSE 2-4 GM/100ML-% IV SOLN
2.0000 g | Freq: Once | INTRAVENOUS | Status: AC
  Administered 2018-04-27: 2 g via INTRAVENOUS

## 2018-04-27 MED ORDER — EPHEDRINE SULFATE 50 MG/ML IJ SOLN
INTRAMUSCULAR | Status: DC | PRN
  Administered 2018-04-27: 5 mg via INTRAVENOUS
  Administered 2018-04-27: 10 mg via INTRAVENOUS

## 2018-04-27 MED ORDER — PREDNISONE 10 MG PO TABS: ORAL_TABLET | ORAL | 0 refills | Status: DC

## 2018-04-27 SURGICAL SUPPLY — 26 items
CANISTER SUCT 1200ML W/VALVE (MISCELLANEOUS) ×3 IMPLANT
COAGULATOR SUCT 8FR VV (MISCELLANEOUS) ×3 IMPLANT
ELECT REM PT RETURN 9FT ADLT (ELECTROSURGICAL) ×3
ELECTRODE REM PT RTRN 9FT ADLT (ELECTROSURGICAL) ×1 IMPLANT
GLOVE PI ULTRA LF STRL 7.5 (GLOVE) ×2 IMPLANT
GLOVE PI ULTRA NON LATEX 7.5 (GLOVE) ×4
GOWN STRL REUS W/ TWL LRG LVL3 (GOWN DISPOSABLE) ×1 IMPLANT
GOWN STRL REUS W/TWL LRG LVL3 (GOWN DISPOSABLE) ×2
KIT TURNOVER KIT A (KITS) ×3 IMPLANT
NEEDLE ANESTHESIA  27G X 3.5 (NEEDLE) ×2
NEEDLE ANESTHESIA 27G X 3.5 (NEEDLE) ×1 IMPLANT
NEEDLE HYPO 27GX1-1/4 (NEEDLE) ×3 IMPLANT
PACK ENT CUSTOM (PACKS) ×3 IMPLANT
PATTIES SURGICAL .5 X3 (DISPOSABLE) ×3 IMPLANT
SOL ANTI-FOG 6CC FOG-OUT (MISCELLANEOUS) ×1 IMPLANT
SOL FOG-OUT ANTI-FOG 6CC (MISCELLANEOUS) ×2
SPLINT NASAL SEPTAL BLV .50 ST (MISCELLANEOUS) ×3 IMPLANT
STRAP BODY AND KNEE 60X3 (MISCELLANEOUS) ×3 IMPLANT
SUT CHROMIC 3-0 (SUTURE) ×2
SUT CHROMIC 3-0 KS 27XMFL CR (SUTURE) ×1
SUT ETHILON 3-0 KS 30 BLK (SUTURE) ×3 IMPLANT
SUT PLAIN GUT 4-0 (SUTURE) ×3 IMPLANT
SUTURE CHRMC 3-0 KS 27XMFL CR (SUTURE) ×1 IMPLANT
SYR 3ML LL SCALE MARK (SYRINGE) ×3 IMPLANT
TOWEL OR 17X26 4PK STRL BLUE (TOWEL DISPOSABLE) ×3 IMPLANT
WATER STERILE IRR 250ML POUR (IV SOLUTION) ×3 IMPLANT

## 2018-04-27 NOTE — Anesthesia Procedure Notes (Signed)
Procedure Name: Intubation Date/Time: 04/27/2018 12:36 PM Performed by: Mayme Genta, CRNA Pre-anesthesia Checklist: Patient identified, Emergency Drugs available, Suction available, Patient being monitored and Timeout performed Patient Re-evaluated:Patient Re-evaluated prior to induction Oxygen Delivery Method: Circle system utilized Preoxygenation: Pre-oxygenation with 100% oxygen Induction Type: IV induction Ventilation: Mask ventilation without difficulty Laryngoscope Size: Miller and 2 Grade View: Grade I Tube type: Oral Rae Tube size: 7.0 mm Number of attempts: 1 Placement Confirmation: ETT inserted through vocal cords under direct vision,  positive ETCO2 and breath sounds checked- equal and bilateral Tube secured with: Tape Dental Injury: Teeth and Oropharynx as per pre-operative assessment

## 2018-04-27 NOTE — Anesthesia Preprocedure Evaluation (Addendum)
Anesthesia Evaluation  Patient identified by MRN, date of birth, ID band Patient awake    Reviewed: Allergy & Precautions, H&P , NPO status , Patient's Chart, lab work & pertinent test results, reviewed documented beta blocker date and time   Airway Mallampati: I  TM Distance: >3 FB Neck ROM: full    Dental no notable dental hx.    Pulmonary neg pulmonary ROS,    Pulmonary exam normal breath sounds clear to auscultation       Cardiovascular Exercise Tolerance: Good negative cardio ROS Normal cardiovascular exam Rhythm:regular Rate:Normal     Neuro/Psych negative neurological ROS  negative psych ROS   GI/Hepatic negative GI ROS, Neg liver ROS,   Endo/Other  negative endocrine ROS  Renal/GU negative Renal ROS  negative genitourinary   Musculoskeletal   Abdominal   Peds  Hematology negative hematology ROS (+) Hx of R breast CA and lumpectomy, 2010   Anesthesia Other Findings   Reproductive/Obstetrics negative OB ROS                            Anesthesia Physical Anesthesia Plan  ASA: II  Anesthesia Plan: General ETT   Post-op Pain Management:    Induction:   PONV Risk Score and Plan: Ondansetron and Dexamethasone  Airway Management Planned:   Additional Equipment:   Intra-op Plan:   Post-operative Plan:   Informed Consent: I have reviewed the patients History and Physical, chart, labs and discussed the procedure including the risks, benefits and alternatives for the proposed anesthesia with the patient or authorized representative who has indicated his/her understanding and acceptance.   Dental Advisory Given  Plan Discussed with: CRNA and Anesthesiologist  Anesthesia Plan Comments:        Anesthesia Quick Evaluation

## 2018-04-27 NOTE — Anesthesia Postprocedure Evaluation (Signed)
Anesthesia Post Note  Patient: Sabrina Turner  Procedure(s) Performed: NASAL SEPTOPLASTY WITH INFERIOR TURBINATE REDUCTION (Bilateral Nose)  Patient location during evaluation: PACU Anesthesia Type: General Level of consciousness: awake and alert Pain management: pain level controlled Vital Signs Assessment: post-procedure vital signs reviewed and stable Respiratory status: spontaneous breathing, nonlabored ventilation, respiratory function stable and patient connected to nasal cannula oxygen Cardiovascular status: blood pressure returned to baseline and stable Postop Assessment: no apparent nausea or vomiting Anesthetic complications: no    Trecia Rogers

## 2018-04-27 NOTE — H&P (Signed)
H&P has been reviewed and patient reevaluated, no changes necessary. To be downloaded later.  

## 2018-04-27 NOTE — Transfer of Care (Signed)
Immediate Anesthesia Transfer of Care Note  Patient: Sabrina Turner  Procedure(s) Performed: NASAL SEPTOPLASTY WITH INFERIOR TURBINATE REDUCTION (Bilateral Nose)  Patient Location: PACU  Anesthesia Type: General ETT  Level of Consciousness: awake, alert  and patient cooperative  Airway and Oxygen Therapy: Patient Spontanous Breathing and Patient connected to supplemental oxygen  Post-op Assessment: Post-op Vital signs reviewed, Patient's Cardiovascular Status Stable, Respiratory Function Stable, Patent Airway and No signs of Nausea or vomiting  Post-op Vital Signs: Reviewed and stable  Complications: No apparent anesthesia complications

## 2018-04-27 NOTE — Op Note (Addendum)
04/27/2018  1:36 PM  [id number]   Pre-Op Dx:  Deviated Nasal Septum, Hypertrophic Inferior Turbinates  Post-op Dx: Same  Proc: Nasal Septoplasty,  Partial Reduction right inferior Turbinate   Surg:  Sabrina Turner Sabrina Turner  Anes:  GOT  EBL: 50 mL  Comp: None  Findings: Septum markedly deviated to the left side with large spurs inferiorly pinching the left inferior turbinate.  The right inferior turbinate was overgrown.    Procedure: With the patient in a comfortable supine position,  general orotracheal anesthesia was induced without difficulty.     The patient received preoperative Afrin spray for topical decongestion and vasoconstriction.  Intravenous prophylactic antibiotics were administered.  At an appropriate level, the patient was placed in a semi-sitting position.  Nasal vibrissae were trimmed.   1% Xylocaine with 1:100,000 epinephrine, 6.5 cc's, was infiltrated into the anterior floor of the nose, into the nasal spine region, into the membranous columella, and finally into the submucoperichondrial plane of the septum on both sides.  Several minutes were allowed for this to take effect.  Cottoniod pledgetts soaked in Afrin and 4% Xylocaine were placed into both nasal cavities and left while the patient was prepped and draped in the standard fashion.  The materials were removed from the nose and observed to be intact and correct in number.  The nose was inspected with a headlight and zero degree scope with the findings as described above.  A left Killian incision was sharply executed and carried down to the quadrangular cartilage. The mucoperichondrium was elelvated along the quadrangular plate back to the bony-cartilaginous junction. The mucoperiostium was then elevated along the ethmoid plate and the vomer. The boney-catilaginous junction was then split with a freer elevator and the mucoperiosteum was elevated on the opposite side. The mucoperiosteum was then elevated along the  maxillary crest as needed to expose the crooked bone of the crest.  Boney spurs of the vomer and maxillary crest were removed with Donavan Foil forceps.  The cartilaginous plate was trimmed along its posterior and inferior borders of about 2 mm of cartilage to free it up inferiorly. Some of the deviated ethmoid plate was then fractured and removed with Takahashi forceps to free up the posterior border of the quadrangular plate and allow it to swing back to the midline. The mucosal flaps were placed back into their anatomic position to allow visualization of the airways. The septum now sat in the midline with an improved airway.  A 3-0 Chromic suture on a Keith needle in used to anchor the inferior septum at the nasal spine with a through and through suture. The mucosal flaps are then sutured together using a through and through whip stitch of 4-0 [place] with a mini-Keith needle. This was used to close the [place] incision as well.   The inferior turbinates were then inspected. An incision was created along the inferior aspect of the right inferior turbinate with removal of some of the inferior soft tissue and bone. Electrocautery was used to control bleeding in the area. The remaining turbinate was then outfractured to open up the airway further. There was no significant bleeding noted. The left inferior turbinate was then visualized.  This was pinched and narrow and lateralized and did not need any further trimming.  The airways were then visualized and showed open passageways on both sides that were significantly improved compared to before surgery. There was no signifcant bleeding. Nasal splints were applied to both sides of the septum using Xomed 0.49mm regular  sized splints that were trimmed, and then held in position with a 3-0 Nylon through and through suture.  The patient was turned back over to anesthesia, and awakened, extubated, and taken to the PACU in satisfactory condition.  Dispo:   PACU to  home  Plan: Ice, elevation, narcotic analgesia, steroid taper, and prophylactic antibiotics for the duration of indwelling nasal foreign bodies.  We will reevaluate the patient in the office in 6 days and remove the septal splints.  Return to work in 10 days, strenuous activities in two weeks.   Sabrina Turner Sabrina Turner 04/27/2018 1:36 PM

## 2018-04-28 ENCOUNTER — Encounter: Payer: Self-pay | Admitting: Otolaryngology

## 2019-02-09 ENCOUNTER — Other Ambulatory Visit: Payer: Self-pay | Admitting: Family Medicine

## 2019-02-09 DIAGNOSIS — Z1231 Encounter for screening mammogram for malignant neoplasm of breast: Secondary | ICD-10-CM

## 2019-03-06 ENCOUNTER — Ambulatory Visit
Admission: RE | Admit: 2019-03-06 | Discharge: 2019-03-06 | Disposition: A | Discharge status: Home / Self Care | Payer: BC Managed Care – PPO | Source: Ambulatory Visit | Attending: Family Medicine | Admitting: Family Medicine

## 2019-03-06 ENCOUNTER — Other Ambulatory Visit: Payer: Self-pay

## 2019-03-06 ENCOUNTER — Encounter (INDEPENDENT_AMBULATORY_CARE_PROVIDER_SITE_OTHER): Payer: Self-pay

## 2019-03-06 DIAGNOSIS — Z1231 Encounter for screening mammogram for malignant neoplasm of breast: Secondary | ICD-10-CM | POA: Insufficient documentation

## 2019-04-20 ENCOUNTER — Ambulatory Visit: Payer: BC Managed Care – PPO | Attending: Internal Medicine

## 2019-04-20 DIAGNOSIS — Z20822 Contact with and (suspected) exposure to covid-19: Secondary | ICD-10-CM

## 2019-04-21 LAB — NOVEL CORONAVIRUS, NAA: SARS-CoV-2, NAA: NOT DETECTED

## 2019-05-29 ENCOUNTER — Ambulatory Visit: Payer: BC Managed Care – PPO | Attending: Internal Medicine

## 2019-05-29 DIAGNOSIS — Z20822 Contact with and (suspected) exposure to covid-19: Secondary | ICD-10-CM

## 2019-05-30 LAB — NOVEL CORONAVIRUS, NAA: SARS-CoV-2, NAA: NOT DETECTED

## 2019-07-12 ENCOUNTER — Ambulatory Visit: Payer: BC Managed Care – PPO

## 2019-07-13 ENCOUNTER — Other Ambulatory Visit: Payer: Self-pay

## 2019-07-13 ENCOUNTER — Ambulatory Visit: Payer: BC Managed Care – PPO | Attending: Internal Medicine

## 2019-07-13 DIAGNOSIS — Z23 Encounter for immunization: Secondary | ICD-10-CM

## 2019-07-13 NOTE — Progress Notes (Signed)
   Covid-19 Vaccination Clinic  Name:  SHARENE PINKHAM    MRN: [id number] DOB: May 08, 1955  07/13/2019  Ms. Roque was observed post Covid-19 immunization for 15 minutes without incident. She was provided with Vaccine Information Sheet and instruction to access the V-Safe system.   Ms. [place] was instructed to call 911 with any severe reactions post vaccine: Marland Kitchen Difficulty breathing  . Swelling of face and throat  . A fast heartbeat  . A bad rash all over body  . Dizziness and weakness   Immunizations Administered    Name Date Dose VIS Date Route   Pfizer COVID-19 Vaccine 07/13/2019 11:49 AM 0.3 mL 04/13/2019 Intramuscular   Manufacturer: [place]   Lot: [id number]   [place]: [id number]

## 2019-08-07 ENCOUNTER — Ambulatory Visit: Payer: BC Managed Care – PPO | Attending: Internal Medicine

## 2019-08-07 DIAGNOSIS — Z23 Encounter for immunization: Secondary | ICD-10-CM

## 2019-08-07 NOTE — Progress Notes (Signed)
   Covid-19 Vaccination Clinic  Name:  LENORA HUEBERT    MRN: [id number] DOB: [birth year]  08/07/2019  Ms. Luiz was observed post Covid-19 immunization for 15 minutes without incident. She was provided with Vaccine Information Sheet and instruction to access the V-Safe system.   Ms. Cheevers was instructed to call 911 with any severe reactions post vaccine: Marland Kitchen Difficulty breathing  . Swelling of face and throat  . A fast heartbeat  . A bad rash all over body  . Dizziness and weakness   Immunizations Administered    Name Date Dose VIS Date Route   Pfizer COVID-19 Vaccine 08/07/2019 11:34 AM 0.3 mL 04/13/2019 Intramuscular   Manufacturer: [place]   Lot: [id number]   [place]: [id number]

## 2020-04-08 ENCOUNTER — Other Ambulatory Visit: Payer: Self-pay | Admitting: Family Medicine

## 2020-04-08 DIAGNOSIS — Z1231 Encounter for screening mammogram for malignant neoplasm of breast: Secondary | ICD-10-CM

## 2020-05-28 ENCOUNTER — Other Ambulatory Visit: Payer: Self-pay

## 2020-05-28 ENCOUNTER — Ambulatory Visit
Admission: RE | Admit: 2020-05-28 | Discharge: 2020-05-28 | Disposition: A | Discharge status: Home / Self Care | Payer: BC Managed Care – PPO | Source: Ambulatory Visit | Attending: Family Medicine | Admitting: Family Medicine

## 2020-05-28 DIAGNOSIS — Z1231 Encounter for screening mammogram for malignant neoplasm of breast: Secondary | ICD-10-CM | POA: Diagnosis present

## 2021-07-17 ENCOUNTER — Other Ambulatory Visit: Payer: Self-pay | Admitting: Family Medicine

## 2021-07-17 DIAGNOSIS — Z1231 Encounter for screening mammogram for malignant neoplasm of breast: Secondary | ICD-10-CM

## 2021-08-25 ENCOUNTER — Ambulatory Visit
Admission: RE | Admit: 2021-08-25 | Discharge: 2021-08-25 | Disposition: A | Discharge status: Home / Self Care | Payer: BC Managed Care – PPO | Source: Ambulatory Visit | Attending: Family Medicine | Admitting: Family Medicine

## 2021-08-25 DIAGNOSIS — Z1231 Encounter for screening mammogram for malignant neoplasm of breast: Secondary | ICD-10-CM | POA: Insufficient documentation

## 2021-08-27 ENCOUNTER — Other Ambulatory Visit: Payer: Self-pay | Admitting: Family Medicine

## 2021-08-27 DIAGNOSIS — R928 Other abnormal and inconclusive findings on diagnostic imaging of breast: Secondary | ICD-10-CM

## 2021-08-27 DIAGNOSIS — N63 Unspecified lump in unspecified breast: Secondary | ICD-10-CM

## 2021-09-09 ENCOUNTER — Ambulatory Visit
Admission: RE | Admit: 2021-09-09 | Discharge: 2021-09-09 | Disposition: A | Discharge status: Home / Self Care | Payer: BC Managed Care – PPO | Source: Ambulatory Visit | Attending: Family Medicine | Admitting: Family Medicine

## 2021-09-09 DIAGNOSIS — N63 Unspecified lump in unspecified breast: Secondary | ICD-10-CM

## 2021-09-09 DIAGNOSIS — R928 Other abnormal and inconclusive findings on diagnostic imaging of breast: Secondary | ICD-10-CM | POA: Diagnosis present

## 2021-09-24 ENCOUNTER — Other Ambulatory Visit: Payer: Self-pay | Admitting: Family Medicine

## 2021-09-24 DIAGNOSIS — N6489 Other specified disorders of breast: Secondary | ICD-10-CM

## 2022-03-16 ENCOUNTER — Other Ambulatory Visit: Payer: BC Managed Care – PPO

## 2022-04-02 ENCOUNTER — Ambulatory Visit
Admission: RE | Admit: 2022-04-02 | Discharge: 2022-04-02 | Disposition: A | Discharge status: Home / Self Care | Payer: BC Managed Care – PPO | Source: Ambulatory Visit | Attending: Family Medicine | Admitting: Family Medicine

## 2022-04-02 DIAGNOSIS — N6489 Other specified disorders of breast: Secondary | ICD-10-CM | POA: Insufficient documentation

## 2022-08-21 ENCOUNTER — Encounter: Payer: Self-pay | Admitting: Emergency Medicine

## 2022-08-21 ENCOUNTER — Ambulatory Visit
Admission: EM | Admit: 2022-08-21 | Discharge: 2022-08-21 | Disposition: A | Discharge status: Home / Self Care | Payer: BC Managed Care – PPO | Attending: Family Medicine | Admitting: Family Medicine

## 2022-08-21 DIAGNOSIS — N3001 Acute cystitis with hematuria: Secondary | ICD-10-CM | POA: Diagnosis present

## 2022-08-21 LAB — URINALYSIS, W/ REFLEX TO CULTURE (INFECTION SUSPECTED)
Bilirubin Urine: NEGATIVE
Glucose, UA: NEGATIVE mg/dL
Ketones, ur: NEGATIVE mg/dL
Nitrite: NEGATIVE
Protein, ur: 100 mg/dL — AB
RBC / HPF: >50 RBC/hpf (ref 0–5)
Specific Gravity, Urine: 1.02 (ref 1.005–1.030)
pH: 7.5 (ref 5.0–8.0)

## 2022-08-21 MED ORDER — CEPHALEXIN 500 MG PO CAPS: 500.0000 mg | ORAL_CAPSULE | Freq: Three times a day (TID) | ORAL | 0 refills | Status: AC

## 2022-08-21 NOTE — ED Triage Notes (Signed)
Patient c/o blood in her urine, and urinary frequency and urgency that started this morning.  Patient denies fevers.

## 2022-08-21 NOTE — ED Provider Notes (Signed)
[facility]    CSN: [id number] Arrival date & time: 08/21/22  1309      History   Chief Complaint Chief Complaint  Patient presents with   Urinary Frequency   Hematuria    HPI Sabrina Turner is a 67 y.o. female presents for evaluation of dysuria.  Patient reports this morning she woke with urinary urgency, frequency and, hematuria.  Denies burning with urination, fevers, nausea/vomiting, flank pain.  No vaginal discharge or STD concern.  States she was in [place] on April 2 when she developed urinary symptoms and her PCP called her in Keflex she took 3 times a day for 7 days.  States symptoms completely resolved.  When her symptoms began this morning she did take one of her husbands 500 mg tablets of Keflex.  Denies history of recurrent UTIs.  She did start drinking sweet tea again which she thinks might be contributing.  No other concerns at this time.   Urinary Frequency  Hematuria    Past Medical History:  Diagnosis Date   Arthritis    right knee   Breast cancer 2010   right breast lumpectomy with rad tx   Personal history of radiation therapy     There are no problems to display for this patient.   Past Surgical History:  Procedure Laterality Date   ABDOMINAL HYSTERECTOMY     BREAST EXCISIONAL BIOPSY Right 2010   hx breast ca radation    BREAST LUMPECTOMY Right 2010   excisional bx hx of breast ca radaition   NASAL SEPTOPLASTY W/ TURBINOPLASTY Bilateral 04/27/2018   Procedure: NASAL SEPTOPLASTY WITH INFERIOR TURBINATE REDUCTION;  Surgeon: Vernie Murders, MD;  Location: [facility];  Service: ENT;  Laterality: Bilateral;   OOPHORECTOMY      OB History   No obstetric history on file.      Home Medications    Prior to Admission medications   Medication Sig Start Date End Date Taking? Authorizing Provider  cephALEXin (KEFLEX) 500 MG capsule Take 1 capsule (500 mg total) by mouth 3 (three) times daily for 7 days. 08/21/22 08/28/22 Yes  Radford Pax, NP  Cyanocobalamin (VITAMIN B-12 PO) Take by mouth daily.    [provider]  EPINEPHrine (AUVI-Q) 0.3 mg/0.3 mL IJ SOAJ injection Inject into the muscle once.    [provider]  Ferrous Sulfate (IRON) 325 (65 Fe) MG TABS Take by mouth daily.    [provider]  Multiple Vitamins-Minerals (CENTRUM SILVER PO) Take by mouth daily.    [provider]  predniSONE (DELTASONE) 10 MG tablet Start with 3 pills tomorrow. Taper over the next 6 days.  3,3,2,2,1,1. 04/27/18   Vernie Murders, MD  venlafaxine (EFFEXOR) 75 MG tablet Take 225 mg by mouth daily.    [provider]    Family History Family History  Problem Relation Age of Onset   Breast cancer Maternal Aunt        mat great aunt    Social History Social History   Tobacco Use   Smoking status: Never   Smokeless tobacco: Never  Vaping Use   Vaping Use: Never used  Substance Use Topics   Alcohol use: Yes    Comment: rare - Holidays     Allergies   Patient has no known allergies.   Review of Systems Review of Systems  Genitourinary:  Positive for frequency and hematuria.     Physical Exam Triage Vital Signs ED Triage Vitals  Enc Vitals  Group     BP 08/21/22 1417 124/78     Pulse Rate 08/21/22 1417 64     Resp 08/21/22 1417 14     Temp 08/21/22 1417 98.2 F (36.8 C)     Temp Source 08/21/22 1417 Oral     SpO2 08/21/22 1417 100 %     Weight 08/21/22 1415 215 lb (97.5 kg)     Height 08/21/22 1415 5\' 4"  (1.626 m)     Head Circumference --      Peak Flow --      Pain Score 08/21/22 1415 5     Pain Loc --      Pain Edu? --      Excl. in GC? --    No data found.  Updated Vital Signs BP 124/78 (BP Location: Left Arm)   Pulse 64   Temp 98.2 F (36.8 C) (Oral)   Resp 14   Ht 5\' 4"  (1.626 m)   Wt 215 lb (97.5 kg)   SpO2 100%   BMI 36.90 kg/m   Visual Acuity Right Eye Distance:   Left Eye Distance:   Bilateral Distance:    Right Eye Near:   Left  Eye Near:    Bilateral Near:     Physical Exam Vitals and nursing note reviewed.  Constitutional:      Appearance: Normal appearance.  HENT:     Head: Normocephalic and atraumatic.  Eyes:     Pupils: Pupils are equal, round, and reactive to light.  Cardiovascular:     Rate and Rhythm: Normal rate.  Pulmonary:     Effort: Pulmonary effort is normal.  Abdominal:     Tenderness: There is no right CVA tenderness or left CVA tenderness.  Skin:    General: Skin is warm and dry.  Neurological:     General: No focal deficit present.     Mental Status: She is alert and oriented to person, place, and time.  Psychiatric:        Mood and Affect: Mood normal.        Behavior: Behavior normal.      UC Treatments / Results  Labs (all labs ordered are listed, but only abnormal results are displayed) Labs Reviewed  URINALYSIS, W/ REFLEX TO CULTURE (INFECTION SUSPECTED) - Abnormal; Notable for the following components:      Result Value   APPearance HAZY (*)    Hgb urine dipstick LARGE (*)    Protein, ur 100 (*)    Leukocytes,Ua MODERATE (*)    Bacteria, UA FEW (*)    All other components within normal limits  URINE CULTURE    EKG   Radiology No results found.  Procedures Procedures (including critical care time)  Medications Ordered in UC Medications - No data to display  Initial Impression / Assessment and Plan / UC Course  I have reviewed the triage vital signs and the nursing notes.  Pertinent labs & imaging results that were available during my care of the patient were reviewed by me and considered in my medical decision making (see chart for details).     UA positive for UTI.  Culture pending As patient is already started taking Keflex we will resume Keflex 3 times daily for 7 days patient to follow-up with PCP if symptoms do not improve ER precautions reviewed and patient verbalized understanding Final Clinical Impressions(s) / UC Diagnoses   Final diagnoses:   Acute cystitis with hematuria     Discharge Instructions  Start Keflex three times a day for 7 days The clinic will contact you with results of your urine culture if it is positive Rest and fluids Follow-up with your PCP if symptoms do not improve Please go to the ER for any worsening symptoms     ED Prescriptions     Medication Sig Dispense Auth. Provider   cephALEXin (KEFLEX) 500 MG capsule Take 1 capsule (500 mg total) by mouth 3 (three) times daily for 7 days. 21 capsule Radford Pax, NP      PDMP not reviewed this encounter.   Radford Pax, NP 08/21/22 1459

## 2022-08-21 NOTE — Discharge Instructions (Signed)
Start Keflex three times a day for 7 days The clinic will contact you with results of your urine culture if it is positive Rest and fluids Follow-up with your PCP if symptoms do not improve Please go to the ER for any worsening symptoms

## 2022-08-22 LAB — URINE CULTURE: Culture: NO GROWTH

## 2022-08-30 ENCOUNTER — Other Ambulatory Visit: Payer: Self-pay | Admitting: Family Medicine

## 2022-08-30 DIAGNOSIS — N6489 Other specified disorders of breast: Secondary | ICD-10-CM

## 2022-10-07 ENCOUNTER — Ambulatory Visit
Admission: RE | Admit: 2022-10-07 | Discharge: 2022-10-07 | Disposition: A | Discharge status: Home / Self Care | Payer: BC Managed Care – PPO | Source: Ambulatory Visit | Attending: Family Medicine | Admitting: Family Medicine

## 2022-10-07 DIAGNOSIS — N6489 Other specified disorders of breast: Secondary | ICD-10-CM

## 2022-12-31 IMAGING — MG MM DIGITAL DIAGNOSTIC UNILAT*L* W/ TOMO W/ CAD
4 series · 4 of 12 positions shown · non-contrast
Comparison: Previous exam(s).

CLINICAL DATA: Callback for possible LEFT breast masses. History of
RIGHT malignancy in 6222.

EXAM:
DIGITAL DIAGNOSTIC UNILATERAL LEFT MAMMOGRAM WITH TOMOSYNTHESIS AND
CAD; ULTRASOUND LEFT BREAST LIMITED
TECHNIQUE: Left digital diagnostic mammography and breast tomosynthesis was
performed. The images were evaluated with computer-aided detection.;
Targeted ultrasound examination of the left breast was performed.

[L ML synth-2D]
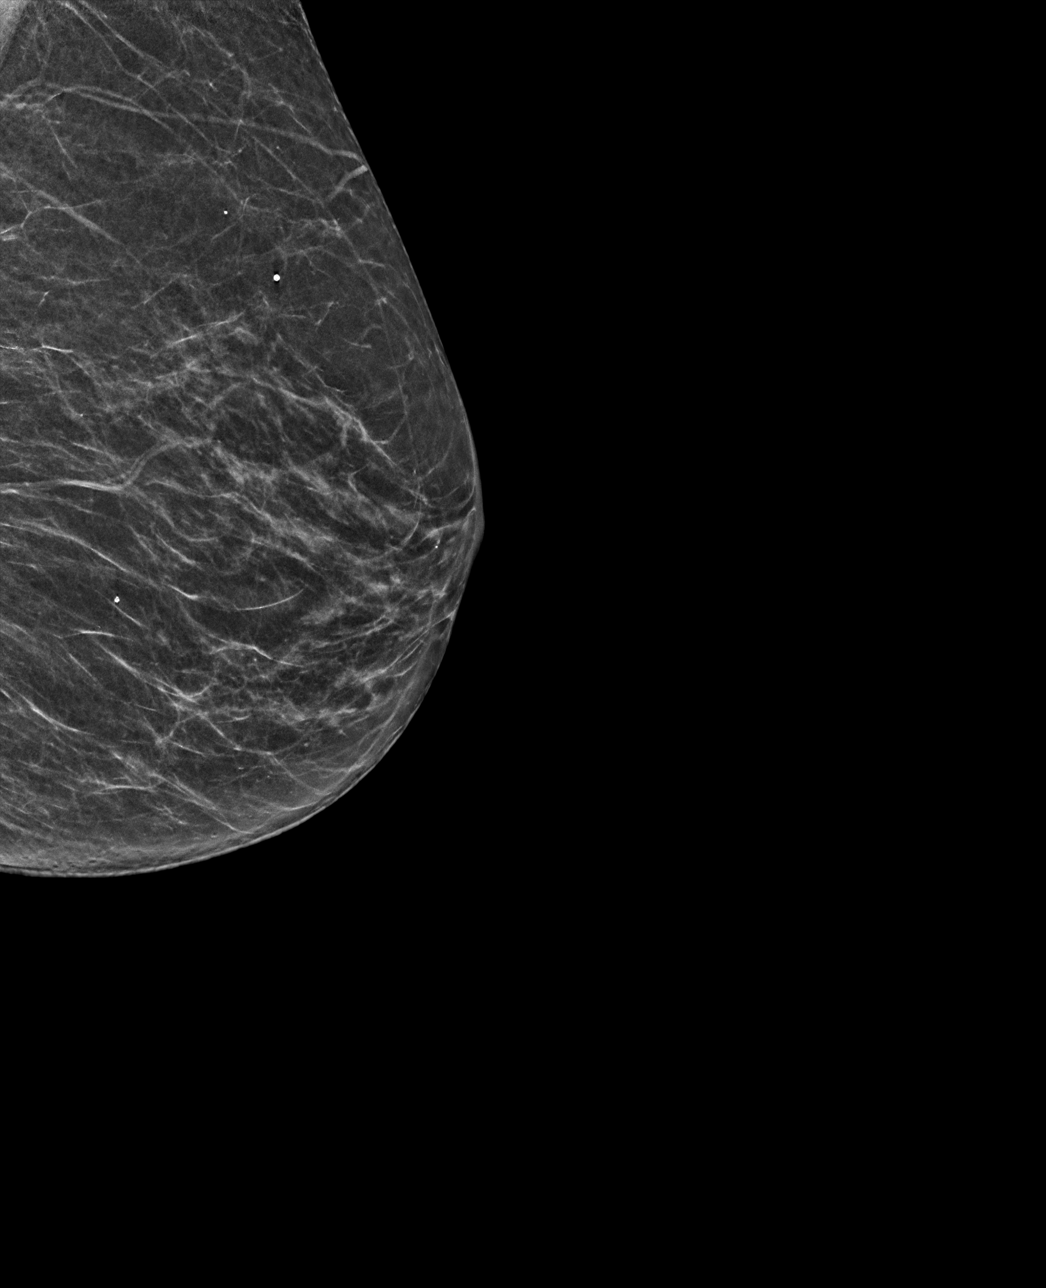

[L CC synth-2D]
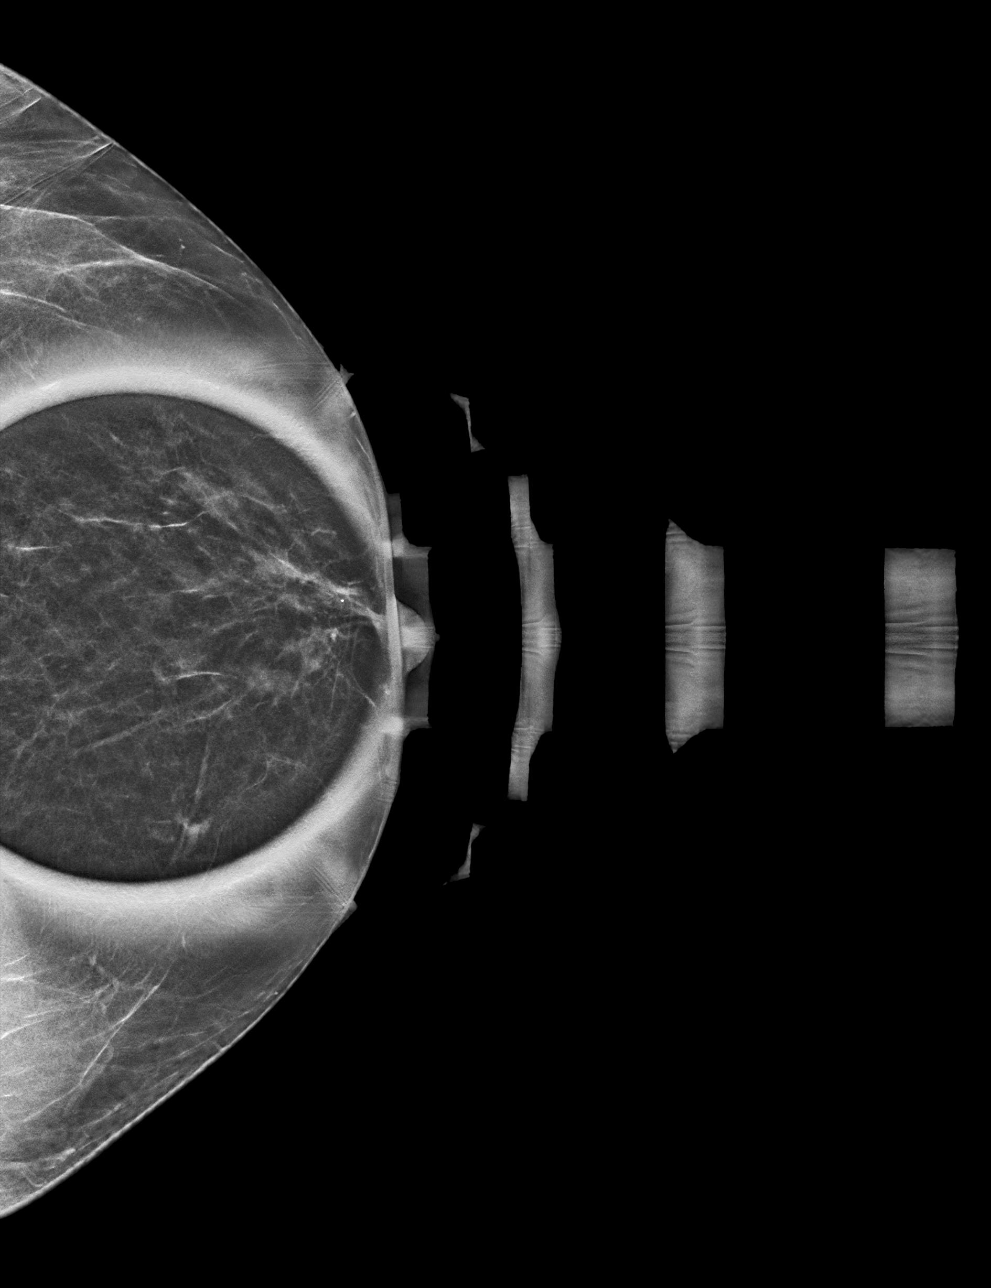

[L ML tomo · tomo slice 26/51.0]
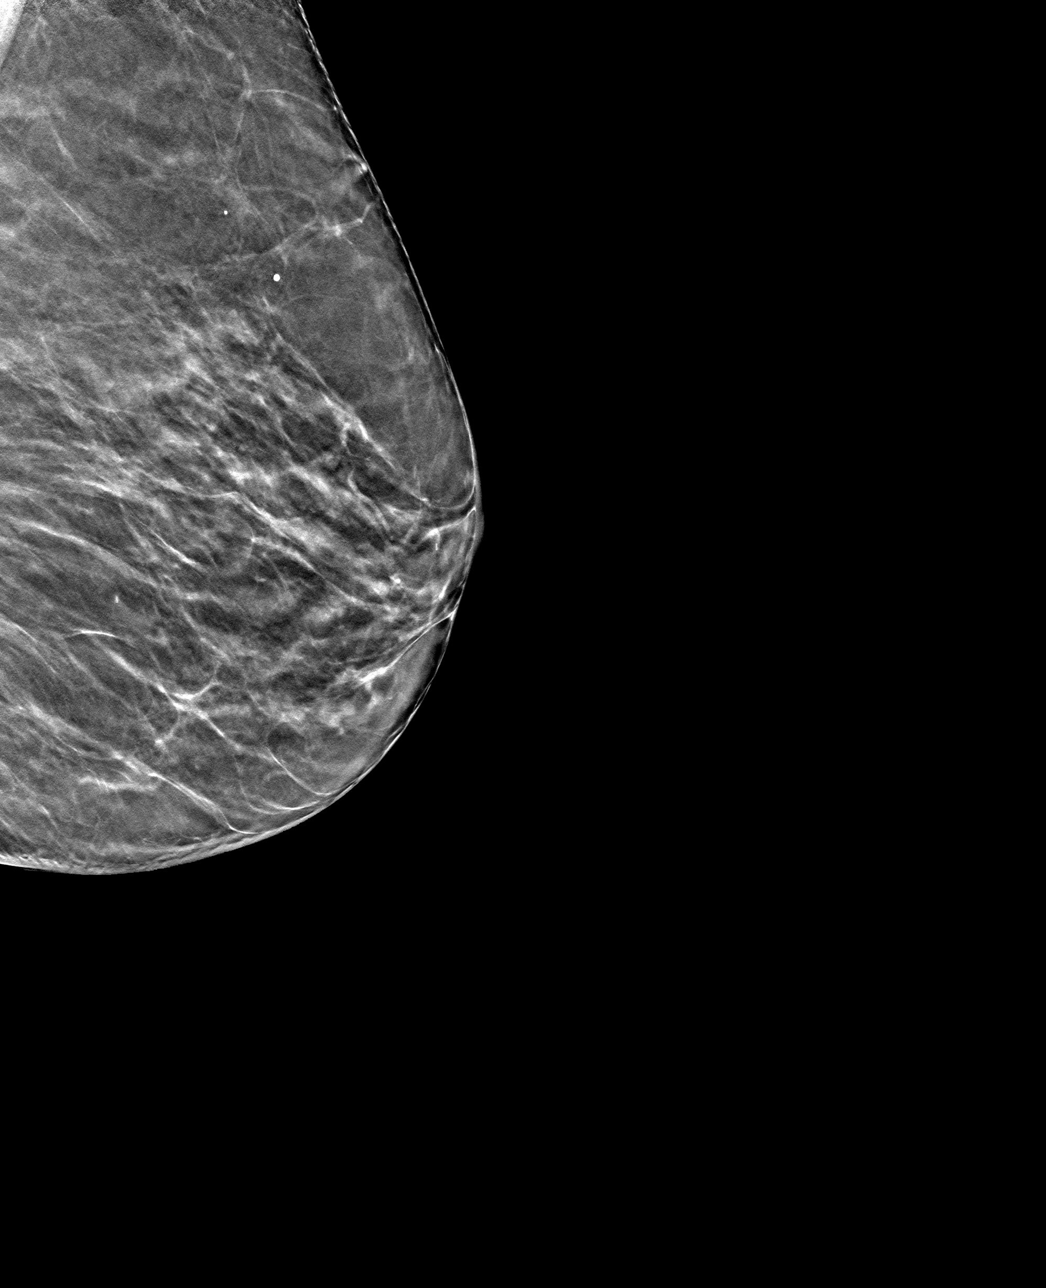

[L CC tomo · tomo slice 21/41.0]
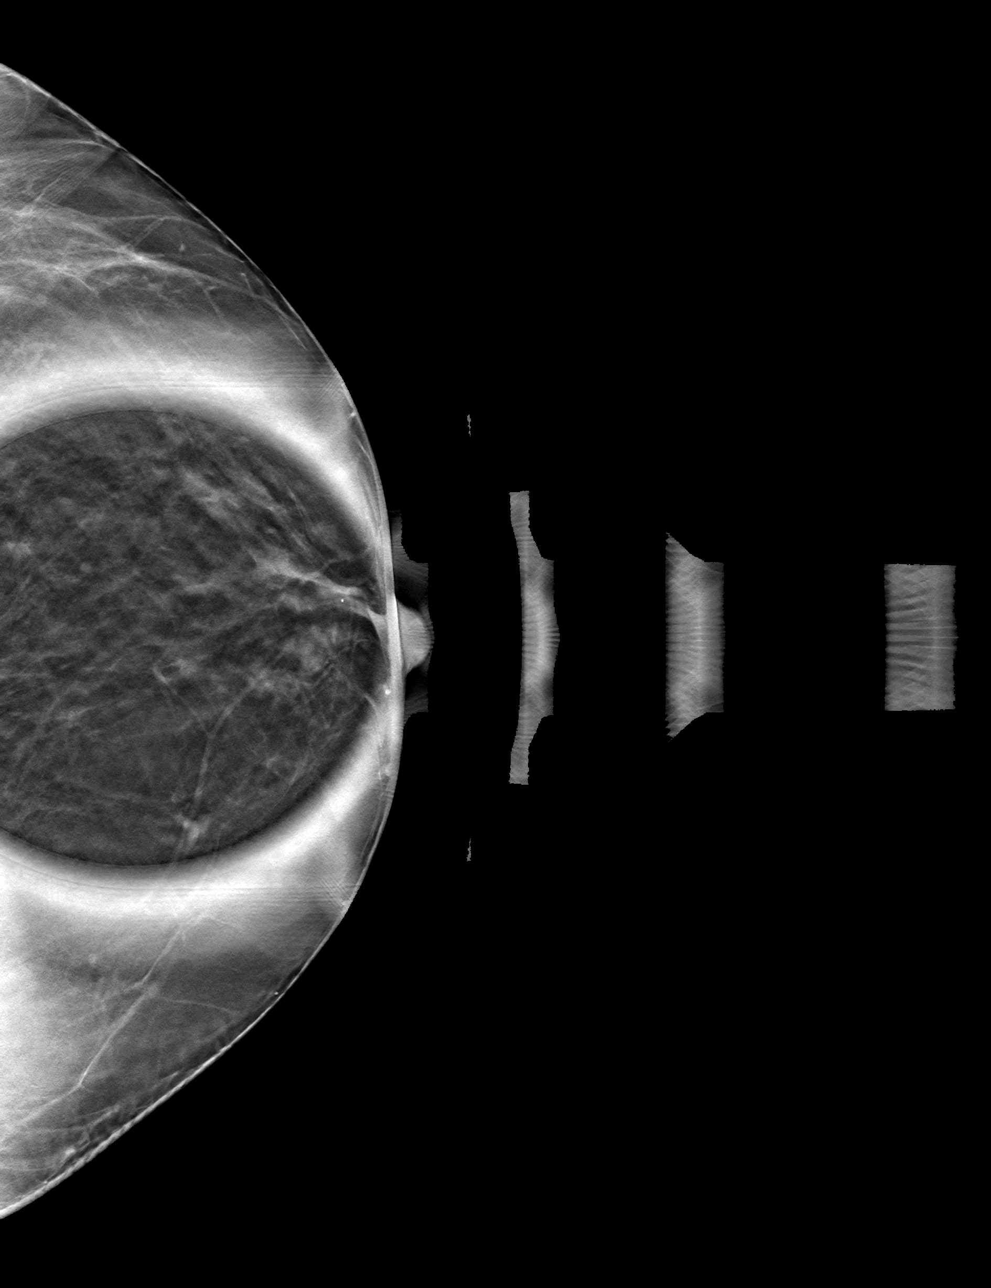

[4 of 12 positions shown; findings below may reference images not displayed]

ACR Breast Density Category b: There are scattered areas of
fibroglandular density.
FINDINGS: Spot compression tomosynthesis views demonstrate resolution of
questioned asymmetry in the upper slightly outer breast at anterior
depth. Spot compression tomosynthesis views confirm persistence of
an oval near tubular asymmetry in the LEFT slightly inner breast at
anterior depth. It is best seen on spot CC slice 28. A prospective
ML is not confidently identified.

On physical exam, no suspicious mass is appreciated.

Targeted LEFT retroareolar ultrasound was performed. No suspicious
cystic or solid mass is seen at the sites of mammographic concern.
There are several regions of focal duct ectasia in the expected site
of the asymmetry noted in the LEFT slightly inner retroareolar
breast. This benign finding may reflect the correlate for the
mammographic finding but is not definitive.
IMPRESSION: 1. There is a probably benign asymmetry in the LEFT inner breast
noted mammographically with a possible benign sonographic correlate.
Given it is unclear whether the benign sonographic correlate
corresponds to the mammographic finding, recommend follow-up
mammogram with ultrasound (if deemed necessary) in 6 months. This
will establish 6 months of definitive stability.

RECOMMENDATION:
LEFT diagnostic mammogram and LEFT breast ultrasound (as deemed
necessary) in 6 months.

I have discussed the findings and recommendations with the patient.
If applicable, a reminder letter will be sent to the patient
regarding the next appointment.

BI-RADS CATEGORY  3: Probably benign.

## 2023-05-20 ENCOUNTER — Ambulatory Visit
Admission: EM | Admit: 2023-05-20 | Discharge: 2023-05-20 | Disposition: A | Discharge status: Home / Self Care | Payer: 59 | Attending: Emergency Medicine | Admitting: Emergency Medicine

## 2023-05-20 DIAGNOSIS — J0110 Acute frontal sinusitis, unspecified: Secondary | ICD-10-CM | POA: Diagnosis not present

## 2023-05-20 MED ORDER — AZITHROMYCIN 250 MG PO TABS: 250.0000 mg | ORAL_TABLET | Freq: Every day | ORAL | 0 refills | Status: AC

## 2023-05-20 MED ORDER — PREDNISONE 10 MG (21) PO TBPK: ORAL_TABLET | Freq: Every day | ORAL | 0 refills | Status: AC

## 2023-05-20 NOTE — ED Provider Notes (Signed)
Sabrina Turner    CSN: [id number] Arrival date & time: 05/20/23  1455      History   Chief Complaint Chief Complaint  Patient presents with   Headache    HPI Sabrina Turner is a 68 y.o. female.   Patient presents for evaluation of nasal congestion, rhinorrhea, productive cough with thick yellow sputum, sore throat, bilateral ear pain and sinus pressure across the forehead present for 5 days.  Progressively worsening.  Exposure to RSV.  Tolerating food and liquids.  Has attempted use of pseudoephedrine.  Past Medical History:  Diagnosis Date   Arthritis    right knee   Breast cancer (HCC) 2010   right breast lumpectomy with rad tx   Personal history of radiation therapy     There are no active problems to display for this patient.   Past Surgical History:  Procedure Laterality Date   ABDOMINAL HYSTERECTOMY     BREAST EXCISIONAL BIOPSY Right 2010   hx breast ca radation    BREAST LUMPECTOMY Right 2010   excisional bx hx of breast ca radaition   NASAL SEPTOPLASTY W/ TURBINOPLASTY Bilateral 04/27/2018   Procedure: NASAL SEPTOPLASTY WITH INFERIOR TURBINATE REDUCTION;  Surgeon: Vernie Murders, MD;  Location: [facility];  Service: ENT;  Laterality: Bilateral;   OOPHORECTOMY      OB History   No obstetric history on file.      Home Medications    Prior to Admission medications   Medication Sig Start Date End Date Taking? Authorizing Provider  azithromycin (ZITHROMAX) 250 MG tablet Take 1 tablet (250 mg total) by mouth daily. Take first 2 tablets together, then 1 every day until finished. 05/20/23  Yes Vern Guerette R, NP  predniSONE (STERAPRED UNI-PAK 21 TAB) 10 MG (21) TBPK tablet Take by mouth daily. Take 6 tabs by mouth daily  for 1 days, then 5 tabs for 1 days, then 4 tabs for 1 days, then 3 tabs for 1 days, 2 tabs for 1 days, then 1 tab by mouth daily for 1 days 05/20/23  Yes Jadon Ressler R, NP  Cyanocobalamin (VITAMIN B-12 PO) Take by mouth  daily.    [provider]  EPINEPHrine (AUVI-Q) 0.3 mg/0.3 mL IJ SOAJ injection Inject into the muscle once.    [provider]  Ferrous Sulfate (IRON) 325 (65 Fe) MG TABS Take by mouth daily.    [provider]  Multiple Vitamins-Minerals (CENTRUM SILVER PO) Take by mouth daily.    [provider]  venlafaxine (EFFEXOR) 75 MG tablet Take 225 mg by mouth daily.    [provider]    Family History Family History  Problem Relation Age of Onset   Breast cancer Maternal Aunt        mat great aunt    Social History Social History   Tobacco Use   Smoking status: Never   Smokeless tobacco: Never  Vaping Use   Vaping status: Never Used  Substance Use Topics   Alcohol use: Yes    Comment: rare - Holidays     Allergies   Dust mite extract, Populus alba, Quercus robur, and [place] allergy skin test   Review of Systems Review of Systems   Physical Exam Triage Vital Signs ED Triage Vitals  Encounter Vitals Group     BP 05/20/23 1519 138/80     Systolic BP Percentile --      Diastolic BP Percentile --      Pulse Rate 05/20/23  1519 78     Resp 05/20/23 1519 18     Temp 05/20/23 1519 (!) 97.2 F (36.2 C)     Temp Source 05/20/23 1519 Temporal     SpO2 05/20/23 1519 98 %     Weight --      Height --      Head Circumference --      Peak Flow --      Pain Score 05/20/23 1518 10     Pain Loc --      Pain Education --      Exclude from Growth Chart --    No data found.  Updated Vital Signs BP 138/80 (BP Location: Left Arm)   Pulse 78   Temp (!) 97.2 F (36.2 C) (Temporal)   Resp 18   SpO2 98%   Visual Acuity Right Eye Distance:   Left Eye Distance:   Bilateral Distance:    Right Eye Near:   Left Eye Near:    Bilateral Near:     Physical Exam Constitutional:      Appearance: Normal appearance.  HENT:     Head: Normocephalic.     Right Ear: Tympanic membrane, ear canal and external ear normal.     Left  Ear: Tympanic membrane, ear canal and external ear normal.     Nose: Congestion present. No rhinorrhea.     Mouth/Throat:     Mouth: Mucous membranes are moist.     Pharynx: Oropharynx is clear.  Eyes:     Extraocular Movements: Extraocular movements intact.  Cardiovascular:     Rate and Rhythm: Normal rate and regular rhythm.     Pulses: Normal pulses.     Heart sounds: Normal heart sounds.  Pulmonary:     Effort: Pulmonary effort is normal.     Breath sounds: Normal breath sounds.  Musculoskeletal:     Cervical back: Normal range of motion and neck supple.  Neurological:     Mental Status: She is alert and oriented to person, place, and time. Mental status is at baseline.      UC Treatments / Results  Labs (all labs ordered are listed, but only abnormal results are displayed) Labs Reviewed - No data to display  EKG   Radiology No results found.  Procedures Procedures (including critical care time)  Medications Ordered in UC Medications - No data to display  Initial Impression / Assessment and Plan / UC Course  I have reviewed the triage vital signs and the nursing notes.  Pertinent labs & imaging results that were available during my care of the patient were reviewed by me and considered in my medical decision making (see chart for details).  Acute nonrecurrent frontal sinusitis  Patient is in no signs of distress nor toxic appearing.  Vital signs are stable.  Low suspicion for pneumonia, pneumothorax or bronchitis and therefore will defer imaging.  Prescribed prednisone and azithromycin.May use additional over-the-counter medications as needed for supportive care.  May follow-up with urgent care as needed if symptoms persist or worsen.  .   Final Clinical Impressions(s) / UC Diagnoses   Final diagnoses:  Acute non-recurrent frontal sinusitis     Discharge Instructions      Your symptoms today are most likely being caused by a virus and should steadily  improve in time it can take up to 7 to 10 days before you truly start to see a turnaround however things will get better, you may take azithromycin prophylactically to keep  symptoms from worsening  Begin prednisone every morning with food for 6 days to reduce inflammation, may take Tylenol in addition to this, this will help with sinus pressure and headache    You can take Tylenol and/or Ibuprofen as needed for fever reduction and pain relief.   For cough: honey 1/2 to 1 teaspoon (you can dilute the honey in water or another fluid).  You can also use guaifenesin and dextromethorphan for cough. You can use a humidifier for chest congestion and cough.  If you don't have a humidifier, you can sit in the bathroom with the hot shower running.      For sore throat: try warm salt water gargles, cepacol lozenges, throat spray, warm tea or water with lemon/honey, popsicles or ice, or OTC cold relief medicine for throat discomfort.   For congestion: take a daily anti-histamine like Zyrtec, Claritin, and a oral decongestant, such as pseudoephedrine.  You can also use Flonase 1-2 sprays in each nostril daily.   It is important to stay hydrated: drink plenty of fluids (water, gatorade/powerade/pedialyte, juices, or teas) to keep your throat moisturized and help further relieve irritation/discomfort.    ED Prescriptions     Medication Sig Dispense Auth. Provider   predniSONE (STERAPRED UNI-PAK 21 TAB) 10 MG (21) TBPK tablet Take by mouth daily. Take 6 tabs by mouth daily  for 1 days, then 5 tabs for 1 days, then 4 tabs for 1 days, then 3 tabs for 1 days, 2 tabs for 1 days, then 1 tab by mouth daily for 1 days 21 tablet Shyler Hamill R, NP   azithromycin (ZITHROMAX) 250 MG tablet Take 1 tablet (250 mg total) by mouth daily. Take first 2 tablets together, then 1 every day until finished. 6 tablet Valinda Hoar, NP      PDMP not reviewed this encounter.   Valinda Hoar, NP 05/20/23 [phone]

## 2023-05-20 NOTE — Discharge Instructions (Signed)
Your symptoms today are most likely being caused by a virus and should steadily improve in time it can take up to 7 to 10 days before you truly start to see a turnaround however things will get better, you may take azithromycin prophylactically to keep symptoms from worsening  Begin prednisone every morning with food for 6 days to reduce inflammation, may take Tylenol in addition to this, this will help with sinus pressure and headache    You can take Tylenol and/or Ibuprofen as needed for fever reduction and pain relief.   For cough: honey 1/2 to 1 teaspoon (you can dilute the honey in water or another fluid).  You can also use guaifenesin and dextromethorphan for cough. You can use a humidifier for chest congestion and cough.  If you don't have a humidifier, you can sit in the bathroom with the hot shower running.      For sore throat: try warm salt water gargles, cepacol lozenges, throat spray, warm tea or water with lemon/honey, popsicles or ice, or OTC cold relief medicine for throat discomfort.   For congestion: take a daily anti-histamine like Zyrtec, Claritin, and a oral decongestant, such as pseudoephedrine.  You can also use Flonase 1-2 sprays in each nostril daily.   It is important to stay hydrated: drink plenty of fluids (water, gatorade/powerade/pedialyte, juices, or teas) to keep your throat moisturized and help further relieve irritation/discomfort.

## 2023-05-20 NOTE — ED Triage Notes (Signed)
Patient presents to UC for HA, sinus pressure, cough, hoarseness x 5 days. Treating with sudafed. Exposed to RSV.  Denies fever or SOB.

## 2024-02-17 ENCOUNTER — Other Ambulatory Visit: Payer: Self-pay | Admitting: Family Medicine

## 2024-02-17 DIAGNOSIS — N6489 Other specified disorders of breast: Secondary | ICD-10-CM

## 2024-02-23 ENCOUNTER — Encounter

## 2024-02-23 ENCOUNTER — Other Ambulatory Visit

## 2024-02-24 ENCOUNTER — Other Ambulatory Visit

## 2024-02-24 ENCOUNTER — Encounter

## 2024-03-02 ENCOUNTER — Ambulatory Visit
Admission: RE | Admit: 2024-03-02 | Discharge: 2024-03-02 | Disposition: A | Discharge status: Home / Self Care | Source: Ambulatory Visit | Attending: Family Medicine | Admitting: Family Medicine

## 2024-03-02 DIAGNOSIS — N6489 Other specified disorders of breast: Secondary | ICD-10-CM
